# Patient Record
Sex: Male | Born: 1990
Health system: Southern US, Community
[De-identification: ages and names within clinical notes are randomized; demographics above are authoritative.]

## PROBLEM LIST (undated history)

## (undated) DIAGNOSIS — F419 Anxiety disorder, unspecified: Secondary | ICD-10-CM

## (undated) DIAGNOSIS — K219 Gastro-esophageal reflux disease without esophagitis: Secondary | ICD-10-CM

## (undated) DIAGNOSIS — E559 Vitamin D deficiency, unspecified: Secondary | ICD-10-CM

---

## 2012-04-27 ENCOUNTER — Emergency Department (HOSPITAL_COMMUNITY)
Admission: EM | Admit: 2012-04-27 | Discharge: 2012-04-27 | Disposition: A | Discharge status: Home / Self Care | Payer: Medicaid Other | Attending: Emergency Medicine | Admitting: Emergency Medicine

## 2012-04-27 ENCOUNTER — Encounter (HOSPITAL_COMMUNITY): Payer: Self-pay | Admitting: Emergency Medicine

## 2012-04-27 DIAGNOSIS — J45909 Unspecified asthma, uncomplicated: Secondary | ICD-10-CM | POA: Insufficient documentation

## 2012-04-27 DIAGNOSIS — N342 Other urethritis: Secondary | ICD-10-CM | POA: Insufficient documentation

## 2012-04-27 MED ORDER — LIDOCAINE HCL (PF) 1 % IJ SOLN
INTRAMUSCULAR | Status: AC
  Administered 2012-04-27: 0.9 mL
  Filled 2012-04-27: qty 5

## 2012-04-27 MED ORDER — CEFTRIAXONE SODIUM 250 MG IJ SOLR
250.0000 mg | Freq: Once | INTRAMUSCULAR | Status: AC
  Administered 2012-04-27: 250 mg via INTRAMUSCULAR
  Filled 2012-04-27: qty 250

## 2012-04-27 MED ORDER — AZITHROMYCIN 1 G PO PACK
1.0000 g | PACK | Freq: Once | ORAL | Status: AC
  Administered 2012-04-27: 1 g via ORAL
  Filled 2012-04-27: qty 1

## 2012-04-27 NOTE — ED Provider Notes (Signed)
History  This chart was scribed for Dione Booze, MD by Bennett Scrape. This patient was seen in room STRE5/STRE5 and the patient's care was started at 3:50PM.  CSN: [id number]  Arrival date & time 04/27/12  1259   First MD Initiated Contact with Patient 04/27/12 1550      Chief Complaint  Patient presents with  . SEXUALLY TRANSMITTED DISEASE    The history is provided by the patient. No language interpreter was used.    Cody [name] is a 21 y.o. male who presents to the Emergency Department complaining of one episode of penile discharge described as white about 2 weeks ago. He denies having the discharge now. He is requesting to be examined for STDs. He reports that he had unprotected sex before the onset of the discharge. He states that he was not able to be examined until now due to a move. He denies dysuria, abdominal pain or back pain. He has a h/o asthma.   He states that he is in the process of transferring to a new PCP.   Past Medical History  Diagnosis Date  . Asthma     No past surgical history on file.  No family history on file.  History  Substance Use Topics  . Smoking status: No  . Smokeless tobacco: Not on file  . Alcohol Use: Yes      Review of Systems  Constitutional: Negative for fever and chills.  Respiratory: Negative for shortness of breath.   Gastrointestinal: Negative for nausea, vomiting and abdominal pain.  Genitourinary: Positive for discharge. Negative for dysuria.  Musculoskeletal: Negative for back pain.  Neurological: Negative for weakness.    Allergies  Penicillins  Home Medications   Current Outpatient Rx  Name Route Sig Dispense Refill  . IBUPROFEN 200 MG PO TABS Oral Take 200 mg by mouth every 6 (six) hours as needed. For toothache.    . ADULT MULTIVITAMIN W/MINERALS CH Oral Take 1 tablet by mouth daily.      Triage Vitals: BP 110/54  Pulse 61  Temp 98.3 F (36.8 C)  Resp 16  Physical Exam  Nursing note and vitals  reviewed. Constitutional: He is oriented to person, place, and time. He appears well-developed and well-nourished. No distress.  HENT:  Head: Normocephalic and atraumatic.  Eyes:       Strabismus present with left eye deviating laterally.  Neck: Neck supple. No tracheal deviation present.  Cardiovascular: Normal rate.   Pulmonary/Chest: Effort normal. No respiratory distress.  Genitourinary:       Uncircumcised, shotty bilateral inguinal lymphadenopathy, no discharge seen  Musculoskeletal: Normal range of motion.  Neurological: He is alert and oriented to person, place, and time.  Skin: Skin is warm and dry.  Psychiatric: He has a normal mood and affect. His behavior is normal.    ED Course  Procedures (including critical care time)    COORDINATION OF CARE: 3:56PM-Discussed treatment for gonorrhea and chlamydia as including a vaccine, antibiotics and a bacterial culture with pt and pt agreed. Advised pt to contact all sexual partners since onset of discharge to let them know to get tested. Advised pt to use a condom with current partner until she has been tested.   Results for orders placed during the hospital encounter of 04/27/12  GC/CHLAMYDIA PROBE AMP, GENITAL      Component Value Range   GC Probe Amp, Genital NEGATIVE  NEGATIVE    Chlamydia, DNA Probe NEGATIVE  NEGATIVE   RPR  Component Value Range   RPR NON REACTIVE  NON REACTIVE   HIV ANTIBODY (ROUTINE TESTING)      Component Value Range   HIV NON REACTIVE  NON REACTIVE      1. Urethritis       MDM  Urethritis which is most likely GC or Chlamydia. He'll be empirically treated in the emergency department.   I personally performed the services described in this documentation, which was scribed in my presence. The recorded information has been reviewed and considered.         Dione Booze, MD 04/30/12 [phone]

## 2012-04-27 NOTE — ED Notes (Signed)
Had a d/c from his  Penis a couple of weeks ago

## 2012-04-27 NOTE — Discharge Instructions (Signed)
Urethritis, Adult Urethritis is an inflammation (soreness) of the urethra (the tube exiting from the bladder). It is often caused by germs that may be spread through sexual contact. TREATMENT  Urethritis will usually respond to antibiotics. These are medications that kill germs. Take all the medicine given to you. You may feel better in a couple days, but TAKE ALL MEDICINE or the infection may not be completely cured and may become more difficult to treat. Response can generally be expected in 7 to 10 days. You may require additional treatment after more testing. HOME CARE INSTRUCTIONS  Not have sex until the test results are known and treatment is completed.   Know that you may be asked to notify your sex partner when your final test results are back.   Finish all medications as prescribed.   Prevent sexually transmitted infections including AIDS. Practice safe sex. Use condoms.  SEEK MEDICAL CARE IF:   Your symptoms are not improved in 2 to 3 days.   Your symptoms are getting worse.   Your develop abdominal pain.   You develop joint pain.  SEEK IMMEDIATE MEDICAL CARE IF:   You have a fever.   You develop severe pain in the belly, back or side.   You develop repeated vomiting.  TEST RESULTS Not all test results are available during your visit. If your test results are not back during the visit, make an appointment with your caregiver to find out the results. Do not assume everything is normal if you have not heard from your caregiver or the medical facility. It is important for you to follow-up on all of your test results. Document Released: 06/03/2001 Document Revised: 11/27/2011 Document Reviewed: 12/24/2009 [facility] Patient Information 2012 [place], [place].  Safer Sex Your caregiver wants you to have this information about the infections that can be transmitted from sexual contact and how to prevent them. The idea behind safer sex is that you can be sexually active, and at the  same time reduce the risk of giving or getting a sexually transmitted disease (STD). Every person should be aware of how to prevent him or herself and his or her sex partner from getting an STD. CAUSES OF STDS STDs are transmitted by sharing body fluids, which contain viruses and bacteria. The following fluids all transmit infections during sexual intercourse and sex acts:  Semen.   Saliva.   Urine.   Blood.   Vaginal mucus.  Examples of STDs include:  Chlamydia.   Gonorrhea.   Genital herpes.   Hepatitis B.   Human immunodeficiency virus or acquired immunodeficiency syndrome (HIV or AIDS).   Syphilis.   Trichomonas.   Pubic lice.   Human papillomavirus (HPV), which may include:   Genital warts.   Cervical dysplasia.   Cervical cancer (can develop with certain types of HPV).  SYMPTOMS  Sexual diseases often cause few or no symptoms until they are advanced, so a person can be infected and spread the infection without knowing it. Some STDs respond to treatment very well. Others, like HIV and herpes, cannot be cured, but are treated to reduce their effects. Specific symptoms include:  Abnormal vaginal discharge.   Irritation or itching in and around the vagina, and in the pubic hair.   Pain during sexual intercourse.   Bleeding during sexual intercourse.   Pelvic or abdominal pain.   Fever.   Growths in and around the vagina.   An ulcer in or around the vagina.   Swollen glands in the  groin area.  DIAGNOSIS   Blood tests.   Pap test.   Culture test of abnormal vaginal discharge.   A test that applies a solution and examines the cervix with a lighted magnifying scope (colposcopy).   A test that examines the pelvis with a lighted tube, through a small incision (laparoscopy).  TREATMENT  The treatment will depend on the cause of the STD.  Antibiotic treatment by injection, oral, creams, or suppositories in the vagina.   Over-the-counter medicated  shampoo, to get rid of pubic lice.   Removing or treating growths with medicine, freezing, burning (electrocautery), or surgery.   Surgery treatment for HPV of the cervix.   Supportive medicines for herpes, HIV, AIDS, and hepatitis.  Being careful cannot eliminate all risk of infection, but sex can be made much safer. Safe sexual practices include body massage and gentle touching. Masturbation is safe, as long as body fluids do not contact skin that has sores or cuts. Dry kissing and oral sex on a man wearing a latex condom or on a woman wearing a male condom is also safe. Slightly less safe is intercourse while the man wears a latex condom or wet kissing. It is also safer to have one sex partner that you know is not having sex with anyone else. LENGTH OF ILLNESS An STD might be treated and cured in a week, sometimes a month, or more. And it can linger with symptoms for many years. STDs can also cause damage to the male organs. This can cause chronic pain, infertility, and recurrence of the STD, especially herpes, hepatitis, HIV, and HPV. HOME CARE INSTRUCTIONS AND PREVENTION  Alcohol and recreational drugs are often the reason given for not practicing safer sex. These substances affect your judgment. Alcohol and recreational drugs can also impair your immune system, making you more vulnerable to disease.   Do not engage in risky and dangerous sexual practices, including:   Vaginal or anal sex without a condom.   Oral sex on a man without a condom.   Oral sex on a woman without a male condom.   Using saliva to lubricate a condom.   Any other sexual contact in which body fluids or blood from one partner contact the other partner.   You should use only latex condoms for men and water soluble lubricants. Petroleum based lubricants or oils used to lubricate a condom will weaken the condom and increase the chance that it will break.   Think very carefully before having sex with anyone  who is high risk for STDs and HIV. This includes IV drug users, people with multiple sexual partners, or people who have had an STD, or a positive hepatitis or HIV blood test.   Remember that even if your partner has had only one previous partner, their previous partner might have had multiple partners. If so, you are at high risk of being exposed to an STD. You and your sex partner should be the only sex partners with each other, with no one else involved.   A vaccine is available for hepatitis B and HPV through your caregiver or the Public Health Department. Everyone should be vaccinated with these vaccines.   Avoid risky sex practices. Sex acts that can break the skin make you more likely to get an STD.  SEEK MEDICAL CARE IF:   If you think you have an STD, even if you do not have any symptoms. Contact your caregiver for evaluation and treatment, if needed.  You think or know your sex partner has acquired an STD.   You have any of the symptoms mentioned above.  Document Released: 01/15/2005 Document Revised: 11/27/2011 Document Reviewed: 11/07/2009 [facility] Patient Information 2012 [place], [place].

## 2012-04-28 LAB — GC/CHLAMYDIA PROBE AMP, GENITAL
Chlamydia, DNA Probe: NEGATIVE
GC Probe Amp, Genital: NEGATIVE

## 2013-01-12 ENCOUNTER — Emergency Department (HOSPITAL_COMMUNITY)
Admission: EM | Admit: 2013-01-12 | Discharge: 2013-01-12 | Disposition: A | Discharge status: Home / Self Care | Payer: Medicaid Other | Attending: Emergency Medicine | Admitting: Emergency Medicine

## 2013-01-12 ENCOUNTER — Encounter (HOSPITAL_COMMUNITY): Payer: Self-pay

## 2013-01-12 DIAGNOSIS — J3489 Other specified disorders of nose and nasal sinuses: Secondary | ICD-10-CM | POA: Insufficient documentation

## 2013-01-12 DIAGNOSIS — R000 Tachycardia, unspecified: Secondary | ICD-10-CM

## 2013-01-12 DIAGNOSIS — R002 Palpitations: Secondary | ICD-10-CM | POA: Insufficient documentation

## 2013-01-12 DIAGNOSIS — J069 Acute upper respiratory infection, unspecified: Secondary | ICD-10-CM

## 2013-01-12 DIAGNOSIS — R059 Cough, unspecified: Secondary | ICD-10-CM | POA: Insufficient documentation

## 2013-01-12 DIAGNOSIS — J45909 Unspecified asthma, uncomplicated: Secondary | ICD-10-CM | POA: Insufficient documentation

## 2013-01-12 DIAGNOSIS — R05 Cough: Secondary | ICD-10-CM | POA: Insufficient documentation

## 2013-01-12 NOTE — ED Notes (Signed)
Pt given discharge paperwork with no additional questions; pt verbalized understanding of discharge; VSS; e-signature obtained; 

## 2013-01-12 NOTE — ED Notes (Signed)
Cough congestion. Also feeling that his heart is racing. Also feel intermittent sob.    Pt. Denies any chest pain

## 2013-01-12 NOTE — ED Provider Notes (Signed)
History   Scribed for No att. providers found, the patient was seen in room TR08C/TR08C . This chart was scribed by Lewanda Rife.   CSN: [id number]  Arrival date & time 01/12/13  1417   First MD Initiated Contact with Patient 01/12/13 1756      Chief Complaint  Patient presents with  . Irregular Heart Beat    (Consider location/radiation/quality/duration/timing/severity/associated sxs/prior treatment) HPI Cody [name] is a 22 y.o. male with past medical history of asthma who presents to the Emergency Department complaining of 2 intermittent episodes of palpitations lasting 1-2 minutes each with an onset last night while sleeping. Pt states his girlfriend woke him from sleep when she noticed he had a high heart rate and the second time he woke up on his own with his "heart racing."   Pt denies chest pain, SOB, diaphoresis, or nausea with the episode.  Pt reports he woke up this morning with nasal congestion, non-productive dry cough and sinus pressure. Pt denies chest pain, shortness of breath, fever, chills, abdominal pain, nausea, vomiting, diarrhea, weakness, dizziness, rash, neck pain, back pain, syncope. Pt reports he has been under a lot of stress with 2 recent family deaths. Pt denies hx of palpitations in the past. Pt denies smoking cigarettes, drinking, and drugs.  Pt reports taking Zyrtec and ibuprofen with moderate relief of URI symptoms.  Neither of these medications are new to the patient and he has never had any reaction to the medications.    Past Medical History  Diagnosis Date  . Asthma     History reviewed. No pertinent past surgical history.  No family history on file.  History  Substance Use Topics  . Smoking status: Not on file  . Smokeless tobacco: Not on file  . Alcohol Use: Yes      Review of Systems  Constitutional: Negative.  Negative for fever and diaphoresis.  HENT: Positive for sinus pressure.   Respiratory: Positive for cough. Negative for  chest tightness and shortness of breath.   Cardiovascular: Positive for palpitations. Negative for chest pain.  Gastrointestinal: Negative.   Musculoskeletal: Negative.   Skin: Negative.   Neurological: Negative.   Hematological: Negative.   Psychiatric/Behavioral: Negative.   All other systems reviewed and are negative.    Allergies  Penicillins  Home Medications   Current Outpatient Rx  Name  Route  Sig  Dispense  Refill  . IBUPROFEN 200 MG PO TABS   Oral   Take 200 mg by mouth every 6 (six) hours as needed. For toothache.           BP 111/71  Pulse 58  Temp 97.6 F (36.4 C) (Oral)  Resp 16  Ht 5\' 8"  (1.727 m)  Wt 140 lb (63.504 kg)  BMI 21.29 kg/m2  SpO2 99%  Physical Exam  Constitutional: He is oriented to person, place, and time. He appears well-developed and well-nourished. No distress.  HENT:  Head: Normocephalic and atraumatic.  Right Ear: Tympanic membrane, external ear and ear canal normal.  Left Ear: Tympanic membrane, external ear and ear canal normal.  Nose: Mucosal edema and rhinorrhea present. No epistaxis. Right sinus exhibits no maxillary sinus tenderness and no frontal sinus tenderness. Left sinus exhibits no maxillary sinus tenderness and no frontal sinus tenderness.  Mouth/Throat: Uvula is midline, oropharynx is clear and moist and mucous membranes are normal. Mucous membranes are not pale and not cyanotic. No oropharyngeal exudate, posterior oropharyngeal edema, posterior oropharyngeal erythema or tonsillar abscesses.  No sinus tenderness  Eyes: Conjunctivae normal are normal. Pupils are equal, round, and reactive to light.  Neck: Normal range of motion and full passive range of motion without pain. Neck supple. No tracheal tenderness present. No mass and no thyromegaly present.  Cardiovascular: Normal rate, regular rhythm, S1 normal, S2 normal, normal heart sounds and intact distal pulses.  Exam reveals no gallop and no friction rub.   No  murmur heard. Pulses:      Radial pulses are 2+ on the right side, and 2+ on the left side.       Dorsalis pedis pulses are 2+ on the right side, and 2+ on the left side.       Posterior tibial pulses are 2+ on the right side, and 2+ on the left side.  Pulmonary/Chest: Effort normal and breath sounds normal. No stridor. No respiratory distress. He has no decreased breath sounds. He has no wheezes. He has no rhonchi. He has no rales. He exhibits no tenderness.  Abdominal: Soft. Bowel sounds are normal. He exhibits no distension and no mass. There is no tenderness. There is no rebound and no guarding.  Musculoskeletal: Normal range of motion. He exhibits no edema and no tenderness.  Lymphadenopathy:    He has no cervical adenopathy.  Neurological: He is alert and oriented to person, place, and time. He exhibits normal muscle tone. Coordination normal.  Skin: Skin is warm and dry. No rash noted. He is not diaphoretic.  Psychiatric: He has a normal mood and affect.    ED Course  Procedures (including critical care time)  Labs Reviewed - No data to display No results found.  ECG:  Date: 01/12/2013  Rate: 56  Rhythm: sinus bradycardia  QRS Axis: normal  Intervals: normal  ST/T Wave abnormalities: early repolarization  Conduction Disutrbances:none  Narrative Interpretation: sinus bradycardia with early repol pattern, nonischemic ECG  Old EKG Reviewed: none available    1. Viral URI   2. Tachycardia       MDM  Elvina Mattes presents with c/o tachycardic episodes last PM.  Pt ECG with early repol pattern, but no tachycardia today.  Pt without known cause for tachycardia.  Denies drug use.  Pt does admit to family stress including the recent death of several family members.  May be anxiety related.  Concern for episodes of SVT, though not evidence of such at this time. Will refer to cardiology for further evaluation.  Pt without chest pain or other associated symptoms including nausea,  weakness, SOB or diaphoresis.  Pt also with mild URI symptoms which he is treating with OTC medications and is satisfied with that treatment.  I have also discussed reasons to return immediately to the ER including further episodes of tachycardia.  Patient expresses understanding and agrees with plan.   1. Medications: usual home medications including mucinex WITHOUT decongestant for cold symptoms 2. Treatment: rest, drink plenty of fluids, avoid medications including decongestants that can cause a fast heart rate 3. Follow Up: Please followup with your primary doctor for discussion of your diagnoses and further evaluation after today's visit; if you do not have a primary care doctor use the resource guide provided to find one; follow-up with cardiology in reference to today's visit    I personally performed the services described in this documentation, which was scribed in my presence. The recorded information has been reviewed and is accurate. '  Dahlia Client Ryelee Albee, PA-C 01/12/13 2320

## 2013-01-13 NOTE — ED Provider Notes (Signed)
Medical screening examination/treatment/procedure(s) were performed by non-physician practitioner and as supervising physician I was immediately available for consultation/collaboration.   Gaelle Adriance M Ivyrose Hashman, DO 01/13/13 1559 

## 2013-01-27 ENCOUNTER — Encounter (HOSPITAL_COMMUNITY): Payer: Self-pay | Admitting: Cardiology

## 2013-01-27 ENCOUNTER — Emergency Department (HOSPITAL_COMMUNITY)
Admission: EM | Admit: 2013-01-27 | Discharge: 2013-01-27 | Disposition: A | Discharge status: Home / Self Care | Payer: Medicaid Other | Attending: Emergency Medicine | Admitting: Emergency Medicine

## 2013-01-27 DIAGNOSIS — Z202 Contact with and (suspected) exposure to infections with a predominantly sexual mode of transmission: Secondary | ICD-10-CM

## 2013-01-27 DIAGNOSIS — N480 Leukoplakia of penis: Secondary | ICD-10-CM | POA: Insufficient documentation

## 2013-01-27 DIAGNOSIS — J45909 Unspecified asthma, uncomplicated: Secondary | ICD-10-CM | POA: Insufficient documentation

## 2013-01-27 DIAGNOSIS — N4889 Other specified disorders of penis: Secondary | ICD-10-CM | POA: Insufficient documentation

## 2013-01-27 DIAGNOSIS — N481 Balanitis: Secondary | ICD-10-CM

## 2013-01-27 MED ORDER — CLOTRIMAZOLE 1 % EX CREA: TOPICAL_CREAM | CUTANEOUS | Status: DC

## 2013-01-27 MED ORDER — DOXYCYCLINE HYCLATE 100 MG PO CAPS: 100.0000 mg | ORAL_CAPSULE | Freq: Two times a day (BID) | ORAL | Status: DC

## 2013-01-27 MED ORDER — DOXYCYCLINE HYCLATE 100 MG PO TABS
100.0000 mg | ORAL_TABLET | Freq: Once | ORAL | Status: AC
  Administered 2013-01-27: 100 mg via ORAL
  Filled 2013-01-27: qty 1

## 2013-01-27 MED ORDER — AZITHROMYCIN 250 MG PO TABS
1000.0000 mg | ORAL_TABLET | Freq: Once | ORAL | Status: AC
  Administered 2013-01-27: 1000 mg via ORAL
  Filled 2013-01-27: qty 4

## 2013-01-27 NOTE — ED Notes (Signed)
Patient states he had injured his penis a few weeks ago and had pus coming from it and from the tip of his penis. It has not improved since then. He states that it increases in pain when he pulls his fore skin back and it is so swollen he cant even do that.

## 2013-01-27 NOTE — ED Notes (Signed)
Pt reports about 3 weeks ago he injured his penis while having sex with his girlfriend. States redness and swelling to the foreskin. Reports area is painful but no problems with urination.

## 2013-01-27 NOTE — ED Provider Notes (Signed)
History     CSN: [id number]  Arrival date & time 01/27/13  1145   First MD Initiated Contact with Patient 01/27/13 1326      Chief Complaint  Patient presents with  . Groin Pain    (Consider location/radiation/quality/duration/timing/severity/associated sxs/prior treatment) Patient is a 22 y.o. male presenting with groin pain. The history is provided by the patient.  Groin Pain This is a new problem. The current episode started 1 to 4 weeks ago. The problem occurs daily. The problem has been gradually worsening. Pertinent negatives include no abdominal pain, anorexia, arthralgias, chest pain, chills, coughing, fever, neck pain or urinary symptoms. Associated symptoms comments: No discharge from the penis, but swelling of the foreskin, and increased redness.. The symptoms are aggravated by intercourse (palpation. attempting to retract foreskin.). He has tried nothing for the symptoms.    Past Medical History  Diagnosis Date  . Asthma     History reviewed. No pertinent past surgical history.  History reviewed. No pertinent family history.  History  Substance Use Topics  . Smoking status: Not on file  . Smokeless tobacco: Not on file  . Alcohol Use: Yes      Review of Systems  Constitutional: Negative for fever, chills and activity change.       All ROS Neg except as noted in HPI  HENT: Negative for nosebleeds and neck pain.   Eyes: Negative for photophobia and discharge.  Respiratory: Negative for cough, shortness of breath and wheezing.   Cardiovascular: Negative for chest pain and palpitations.  Gastrointestinal: Negative for abdominal pain, blood in stool and anorexia.  Genitourinary: Positive for penile swelling and penile pain. Negative for dysuria, urgency, frequency, hematuria, discharge, scrotal swelling, difficulty urinating and testicular pain.  Musculoskeletal: Negative for back pain and arthralgias.  Skin: Negative.   Neurological: Negative for dizziness,  seizures and speech difficulty.  Psychiatric/Behavioral: Negative for hallucinations and confusion. The patient is nervous/anxious.     Allergies  Penicillins and Zyrtec  Home Medications   Current Outpatient Rx  Name  Route  Sig  Dispense  Refill  . IBUPROFEN 200 MG PO TABS   Oral   Take 200 mg by mouth every 6 (six) hours as needed. For toothache.         Marland Kitchen CLOTRIMAZOLE 1 % EX CREA      Apply to affected area 2 times daily for 7 to 10 days.   12 g   0   . DOXYCYCLINE HYCLATE 100 MG PO CAPS   Oral   Take 1 capsule (100 mg total) by mouth 2 (two) times daily.   14 capsule   0     BP 130/65  Pulse 82  Temp 98.3 F (36.8 C) (Oral)  Resp 18  SpO2 100%  Physical Exam  Nursing note and vitals reviewed. Constitutional: He is oriented to person, place, and time. He appears well-developed and well-nourished.  Non-toxic appearance.  HENT:  Head: Normocephalic.  Right Ear: Tympanic membrane and external ear normal.  Left Ear: Tympanic membrane and external ear normal.  Eyes: EOM and lids are normal. Pupils are equal, round, and reactive to light.  Neck: Normal range of motion. Neck supple. Carotid bruit is not present.  Cardiovascular: Normal rate, regular rhythm, normal heart sounds, intact distal pulses and normal pulses.   Pulmonary/Chest: Breath sounds normal. No respiratory distress.  Abdominal: Soft. Bowel sounds are normal. There is no tenderness. There is no guarding.  Genitourinary:  Pt has 4 broken skin areas of the foreskin just behind the head of the penis. Several broken skin areas of the foreskin at the head. No lesion of the testicles. Few small inguinal lymph nodes.  Musculoskeletal: Normal range of motion.  Lymphadenopathy:       Head (right side): No submandibular adenopathy present.       Head (left side): No submandibular adenopathy present.    He has no cervical adenopathy.  Neurological: He is alert and oriented to person, place, and time. He  has normal strength. No cranial nerve deficit or sensory deficit.  Skin: Skin is warm and dry.  Psychiatric: His speech is normal. His mood appears anxious.    ED Course  Procedures (including critical care time)  Labs Reviewed - No data to display No results found.   1. Balanitis   2. Possible exposure to STD       MDM  I have reviewed nursing notes, vital signs, and all appropriate lab and imaging results for this patient.  Pt's girlfriend was recently treated for STD. Pt treated today with zithromax and doxycycline(allergic to penicillin). Pt has broken skin areas that appear traumatic on the foreskin. Redness and pain when foreskin retracted. Pt advised on the importance  Of good foreskin cleansing, especially after intercourse. Rx for clotrimazol given for assistance with balanitis. [facility] week.       Kathie Dike, [place] 01/27/13 2259

## 2013-01-28 ENCOUNTER — Emergency Department (HOSPITAL_COMMUNITY)
Admission: EM | Admit: 2013-01-28 | Discharge: 2013-01-28 | Disposition: A | Discharge status: Home / Self Care | Payer: Medicaid Other | Attending: Emergency Medicine | Admitting: Emergency Medicine

## 2013-01-28 ENCOUNTER — Encounter (HOSPITAL_COMMUNITY): Payer: Self-pay | Admitting: *Deleted

## 2013-01-28 DIAGNOSIS — N471 Phimosis: Secondary | ICD-10-CM | POA: Insufficient documentation

## 2013-01-28 DIAGNOSIS — N478 Other disorders of prepuce: Secondary | ICD-10-CM | POA: Insufficient documentation

## 2013-01-28 DIAGNOSIS — J45909 Unspecified asthma, uncomplicated: Secondary | ICD-10-CM | POA: Insufficient documentation

## 2013-01-28 DIAGNOSIS — Z79899 Other long term (current) drug therapy: Secondary | ICD-10-CM | POA: Insufficient documentation

## 2013-01-28 DIAGNOSIS — R300 Dysuria: Secondary | ICD-10-CM | POA: Insufficient documentation

## 2013-01-28 MED ORDER — BACITRACIN ZINC 500 UNIT/GM EX OINT: TOPICAL_OINTMENT | Freq: Two times a day (BID) | CUTANEOUS | Status: DC

## 2013-01-28 MED ORDER — BACITRACIN ZINC 500 UNIT/GM EX OINT
1.0000 "application " | TOPICAL_OINTMENT | Freq: Two times a day (BID) | CUTANEOUS | Status: DC
  Administered 2013-01-28: 1 via TOPICAL
  Filled 2013-01-28: qty 15

## 2013-01-28 NOTE — ED Provider Notes (Signed)
History     CSN: [id number]  Arrival date & time 01/28/13  1248   First MD Initiated Contact with Patient 01/28/13 1338      Chief Complaint  Patient presents with  . Penis Pain    (Consider location/radiation/quality/duration/timing/severity/associated sxs/prior treatment) HPI Comments: The patient was seen and treated here yesterday for balanitis. He had some phimosis, but has been unable to retract his foreskin. He has slight increase in swelling and with retraction of the foreskin is now developing some cracking of the skin. He has mild dysuria when he urinates, this is related to his foreskin, he denies any penile discharge, he also describes to me that he has had a history of similar phimosis in the past which was not nearly as severe. He is uncircumcised. The symptoms are persistent, gradually worsening, nothing makes it better, worse with trying to retract it. He denies any fevers or chills nausea or vomiting.  Patient is a 22 y.o. male presenting with penile pain. The history is provided by the patient.  Penis Pain    Past Medical History  Diagnosis Date  . Asthma     History reviewed. No pertinent past surgical history.  No family history on file.  History  Substance Use Topics  . Smoking status: Never Smoker   . Smokeless tobacco: Not on file  . Alcohol Use: No      Review of Systems  Genitourinary: Positive for penile pain.  All other systems reviewed and are negative.    Allergies  Penicillins and Zyrtec  Home Medications   Current Outpatient Rx  Name  Route  Sig  Dispense  Refill  . CLOTRIMAZOLE 1 % EX CREA      Apply to affected area 2 times daily for 7 to 10 days.   12 g   0   . DOXYCYCLINE HYCLATE 100 MG PO CAPS   Oral   Take 1 capsule (100 mg total) by mouth 2 (two) times daily.   14 capsule   0   . IBUPROFEN 200 MG PO TABS   Oral   Take 200 mg by mouth every 6 (six) hours as needed. For toothache.         Marland Kitchen BACITRACIN ZINC 500  UNIT/GM EX OINT   Topical   Apply topically 2 (two) times daily.   120 g   0     BP 147/54  Pulse 67  Temp 98.1 F (36.7 C) (Oral)  Resp 16  SpO2 99%  Physical Exam  Nursing note and vitals reviewed. Constitutional: He appears well-developed and well-nourished. No distress.  HENT:  Head: Normocephalic and atraumatic.  Mouth/Throat: Oropharynx is clear and moist. No oropharyngeal exudate.  Eyes: Conjunctivae normal and EOM are normal. Pupils are equal, round, and reactive to light. Right eye exhibits no discharge. Left eye exhibits no discharge. No scleral icterus.  Neck: Normal range of motion. Neck supple. No JVD present. No thyromegaly present.  Cardiovascular: Normal rate, regular rhythm, normal heart sounds and intact distal pulses.  Exam reveals no gallop and no friction rub.   No murmur heard. Pulmonary/Chest: Effort normal and breath sounds normal. No respiratory distress. He has no wheezes. He has no rales.  Abdominal: Soft. Bowel sounds are normal. He exhibits no distension and no mass. There is no tenderness.  Genitourinary:        Phimosis present, unable to retract the foreskin of the penis, normal appearing testicles and scrotum, no discharge, the patient is able to  urinate  Musculoskeletal: Normal range of motion. He exhibits no edema and no tenderness.  Lymphadenopathy:    He has no cervical adenopathy.  Neurological: He is alert. Coordination normal.  Skin: Skin is warm and dry. No rash noted. No erythema.  Psychiatric: He has a normal mood and affect. His behavior is normal.    ED Course  Procedures (including critical care time)  Labs Reviewed - No data to display No results found.   1. Phimosis       MDM  Clinically the patient has a phimosis, he has been taking doxycycline, I will advise him to use bacitracin and Vaseline to try retract his foreskin, urology has been paged.   I have been unable to fully retract the foreskin however he is able  to urinate and does not have any discharge or erythema or swelling of the foreskin. I have discussed his care with the nurse for the urologist who is on call. I have asked that he can be seen on Monday in the office. He will call to make an appointment today.     Vida Roller, MD 01/28/13 [phone]

## 2013-01-28 NOTE — ED Notes (Addendum)
Pt was tx here and discharged after being tx for poss std.  He was told to pull back the foreskin to clean himself.  However, this am, he felt like he was going to tear if he pulled back.  Also feels increased swelling and pain. PT is able to urinate.

## 2013-01-31 NOTE — ED Provider Notes (Signed)
Medical screening examination/treatment/procedure(s) were performed by non-physician practitioner and as supervising physician I was immediately available for consultation/collaboration.   Cristina Ceniceros E Emireth Cockerham, MD 01/31/13 0744 

## 2013-03-03 ENCOUNTER — Other Ambulatory Visit: Payer: Self-pay | Admitting: Urology

## 2013-03-16 ENCOUNTER — Encounter (HOSPITAL_BASED_OUTPATIENT_CLINIC_OR_DEPARTMENT_OTHER): Payer: Self-pay | Admitting: *Deleted

## 2013-03-16 NOTE — Progress Notes (Signed)
To [facility] at 0715-Hg on arrival-NPO after Mn.

## 2013-03-17 ENCOUNTER — Emergency Department (HOSPITAL_COMMUNITY)
Admission: EM | Admit: 2013-03-17 | Discharge: 2013-03-17 | Disposition: A | Discharge status: Home / Self Care | Payer: Medicaid Other | Attending: Emergency Medicine | Admitting: Emergency Medicine

## 2013-03-17 ENCOUNTER — Encounter (HOSPITAL_COMMUNITY): Payer: Self-pay | Admitting: Emergency Medicine

## 2013-03-17 DIAGNOSIS — J45909 Unspecified asthma, uncomplicated: Secondary | ICD-10-CM | POA: Insufficient documentation

## 2013-03-17 DIAGNOSIS — R0981 Nasal congestion: Secondary | ICD-10-CM

## 2013-03-17 DIAGNOSIS — J3489 Other specified disorders of nose and nasal sinuses: Secondary | ICD-10-CM | POA: Insufficient documentation

## 2013-03-17 MED ORDER — LORATADINE 10 MG PO TABS: 10.0000 mg | ORAL_TABLET | Freq: Every day | ORAL | Status: DC

## 2013-03-17 MED ORDER — ASPIRIN-ACETAMINOPHEN-CAFFEINE 250-250-65 MG PO TABS: 1.0000 | ORAL_TABLET | Freq: Four times a day (QID) | ORAL | Status: DC | PRN

## 2013-03-17 NOTE — ED Notes (Signed)
Feels like sinus are backing up occ feet go numb

## 2013-03-17 NOTE — ED Provider Notes (Signed)
History     CSN: [id number]  Arrival date & time 03/17/13  1122   First MD Initiated Contact with Patient 03/17/13 1145      Chief Complaint  Patient presents with  . Headache    (Consider location/radiation/quality/duration/timing/severity/associated sxs/prior treatment) HPI Comments: Patient is a 22 year old male with a past medical history of asthma who presents with a headache for 3 weeks. Patient reports a gradual onset and progressive worsening of the headache. The pain is aching, intermittent and is located in generalized head without radiation. Patient has tried ibuprofen for symptoms without relief. No alleviating/aggravating factors. Patient reports associated nasal congestion. Patient denies fever, nausea, photophobia, vomiting, diarrhea, numbness/tingling, weakness, visual changes, chest pain, SOB, abdominal pain.      Past Medical History  Diagnosis Date  . Asthma     childhood    No past surgical history on file.  No family history on file.  History  Substance Use Topics  . Smoking status: Never Smoker   . Smokeless tobacco: Not on file  . Alcohol Use: No      Review of Systems  Neurological: Positive for headaches.  All other systems reviewed and are negative.    Allergies  Penicillins and Zyrtec  Home Medications   Current Outpatient Rx  Name  Route  Sig  Dispense  Refill  . bacitracin ointment   Topical   Apply topically 2 (two) times daily.   120 g   0   . clotrimazole (LOTRIMIN) 1 % cream      Apply to affected area 2 times daily for 7 to 10 days.   12 g   0   . doxycycline (VIBRAMYCIN) 100 MG capsule   Oral   Take 1 capsule (100 mg total) by mouth 2 (two) times daily.   14 capsule   0   . ibuprofen (ADVIL,MOTRIN) 200 MG tablet   Oral   Take 200 mg by mouth every 6 (six) hours as needed. For toothache.           BP 123/69  Pulse 60  Temp(Src) 97.7 F (36.5 C)  Resp 16  SpO2 99%  Physical Exam  Nursing note and  vitals reviewed. Constitutional: He is oriented to person, place, and time. He appears well-developed and well-nourished. No distress.  HENT:  Head: Normocephalic and atraumatic.  Mouth/Throat: Oropharynx is clear and moist. No oropharyngeal exudate.  NO maxillary or sinus tenderness to palpation.   Eyes: Conjunctivae are normal. Pupils are equal, round, and reactive to light.  Neck: Normal range of motion. Neck supple.  Cardiovascular: Normal rate and regular rhythm.  Exam reveals no gallop and no friction rub.   No murmur heard. Pulmonary/Chest: Effort normal and breath sounds normal. He has no wheezes. He has no rales. He exhibits no tenderness.  Abdominal: Soft. There is no tenderness.  Musculoskeletal: Normal range of motion.  Neurological: He is alert and oriented to person, place, and time. No cranial nerve deficit. Coordination normal.  Speech is goal-oriented. Moves limbs without ataxia.   Skin: Skin is warm and dry.  Psychiatric: He has a normal mood and affect. His behavior is normal.    ED Course  Procedures (including critical care time)  Labs Reviewed - No data to display No results found.   1. Sinus congestion       MDM  11:57 AM Patient likely has a headache due to sinus congestion. Patient reports relief with Neti pot. He denies any injury  or known triggers. He has no neuro deficits. Patient will be treated with claritin and excedrin. Patient instructed to follow up with PCP. Patient instructed to return with worsening or concerning symptoms.         Emilia Beck, [place] 03/18/13 1906

## 2013-03-17 NOTE — ED Notes (Signed)
H/a x 3 weeks has not seen  His reg dr

## 2013-03-17 NOTE — ED Notes (Addendum)
Pt reports HA X3 weeks that is daily and random.  Pt states his headaches can be triggered "by things people say or random things". Pt reports headache causes occassional numbness in his feet.  Pt states he has surgery scheduled on 4/2 and was told to see PCP about headaches. Pt has used netty pot for sinus and headache was back the next day. Tylenol and advil do not help.  Pt alert oriented X4

## 2013-03-19 NOTE — ED Provider Notes (Signed)
Medical screening examination/treatment/procedure(s) were performed by non-physician practitioner and as supervising physician I was immediately available for consultation/collaboration.  Derwood Kaplan, MD 03/19/13 1540

## 2013-03-23 ENCOUNTER — Ambulatory Visit (HOSPITAL_BASED_OUTPATIENT_CLINIC_OR_DEPARTMENT_OTHER)
Admission: RE | Admit: 2013-03-23 | Discharge: 2013-03-23 | Disposition: A | Discharge status: Home / Self Care | Payer: Medicaid Other | Source: Ambulatory Visit | Attending: Urology | Admitting: Urology

## 2013-03-23 ENCOUNTER — Encounter (HOSPITAL_BASED_OUTPATIENT_CLINIC_OR_DEPARTMENT_OTHER): Payer: Self-pay | Admitting: *Deleted

## 2013-03-23 ENCOUNTER — Encounter (HOSPITAL_BASED_OUTPATIENT_CLINIC_OR_DEPARTMENT_OTHER): Payer: Self-pay | Admitting: Anesthesiology

## 2013-03-23 ENCOUNTER — Encounter (HOSPITAL_BASED_OUTPATIENT_CLINIC_OR_DEPARTMENT_OTHER)
Admission: RE | Disposition: A | Discharge status: Home / Self Care | Payer: Self-pay | Source: Ambulatory Visit | Attending: Urology

## 2013-03-23 ENCOUNTER — Ambulatory Visit (HOSPITAL_BASED_OUTPATIENT_CLINIC_OR_DEPARTMENT_OTHER): Payer: Medicaid Other | Admitting: Anesthesiology

## 2013-03-23 DIAGNOSIS — N478 Other disorders of prepuce: Secondary | ICD-10-CM | POA: Insufficient documentation

## 2013-03-23 DIAGNOSIS — Z888 Allergy status to other drugs, medicaments and biological substances status: Secondary | ICD-10-CM | POA: Insufficient documentation

## 2013-03-23 DIAGNOSIS — Z880 Allergy status to penicillin: Secondary | ICD-10-CM | POA: Insufficient documentation

## 2013-03-23 DIAGNOSIS — Z8709 Personal history of other diseases of the respiratory system: Secondary | ICD-10-CM | POA: Insufficient documentation

## 2013-03-23 DIAGNOSIS — N471 Phimosis: Secondary | ICD-10-CM | POA: Insufficient documentation

## 2013-03-23 DIAGNOSIS — K219 Gastro-esophageal reflux disease without esophagitis: Secondary | ICD-10-CM | POA: Insufficient documentation

## 2013-03-23 DIAGNOSIS — N476 Balanoposthitis: Secondary | ICD-10-CM | POA: Insufficient documentation

## 2013-03-23 HISTORY — PX: CIRCUMCISION: SHX1350

## 2013-03-23 SURGERY — CIRCUMCISION, ADULT
Anesthesia: General | Site: Penis | Wound class: Clean

## 2013-03-23 MED ORDER — FENTANYL CITRATE 0.05 MG/ML IJ SOLN
25.0000 ug | INTRAMUSCULAR | Status: DC | PRN
  Filled 2013-03-23: qty 1

## 2013-03-23 MED ORDER — LIDOCAINE HCL (CARDIAC) 20 MG/ML IV SOLN
INTRAVENOUS | Status: DC | PRN
  Administered 2013-03-23: 50 mg via INTRAVENOUS

## 2013-03-23 MED ORDER — LIDOCAINE HCL (PF) 1 % IJ SOLN
INTRAMUSCULAR | Status: DC | PRN
  Administered 2013-03-23: 10 mL

## 2013-03-23 MED ORDER — CLINDAMYCIN PHOSPHATE 900 MG/50ML IV SOLN
900.0000 mg | Freq: Once | INTRAVENOUS | Status: AC
  Administered 2013-03-23: 900 mg via INTRAVENOUS
  Filled 2013-03-23: qty 50

## 2013-03-23 MED ORDER — PROPOFOL 10 MG/ML IV BOLUS
INTRAVENOUS | Status: DC | PRN
Start: 2013-03-23 — End: 2013-03-23
  Administered 2013-03-23: 200 mg via INTRAVENOUS

## 2013-03-23 MED ORDER — MEPERIDINE HCL 25 MG/ML IJ SOLN
6.2500 mg | INTRAMUSCULAR | Status: DC | PRN
  Filled 2013-03-23: qty 1

## 2013-03-23 MED ORDER — FENTANYL CITRATE 0.05 MG/ML IJ SOLN
INTRAMUSCULAR | Status: DC | PRN
  Administered 2013-03-23: 25 ug via INTRAVENOUS
  Administered 2013-03-23 (×2): 50 ug via INTRAVENOUS

## 2013-03-23 MED ORDER — TRAMADOL HCL 50 MG PO TABS
50.0000 mg | ORAL_TABLET | Freq: Four times a day (QID) | ORAL | Status: DC | PRN
  Filled 2013-03-23: qty 1

## 2013-03-23 MED ORDER — LACTATED RINGERS IV SOLN
INTRAVENOUS | Status: DC
  Administered 2013-03-23 (×2): via INTRAVENOUS
  Filled 2013-03-23: qty 1000

## 2013-03-23 MED ORDER — TRAMADOL HCL 50 MG PO TABS: 50.0000 mg | ORAL_TABLET | Freq: Four times a day (QID) | ORAL | Status: DC | PRN

## 2013-03-23 MED ORDER — ONDANSETRON HCL 4 MG/2ML IJ SOLN
INTRAMUSCULAR | Status: DC | PRN
  Administered 2013-03-23: 4 mg via INTRAVENOUS

## 2013-03-23 MED ORDER — SENNA-DOCUSATE SODIUM 8.6-50 MG PO TABS: 1.0000 | ORAL_TABLET | Freq: Two times a day (BID) | ORAL | Status: DC

## 2013-03-23 MED ORDER — LACTATED RINGERS IV SOLN
INTRAVENOUS | Status: DC
  Filled 2013-03-23: qty 1000

## 2013-03-23 MED ORDER — PROMETHAZINE HCL 25 MG/ML IJ SOLN
6.2500 mg | INTRAMUSCULAR | Status: DC | PRN
  Filled 2013-03-23: qty 1

## 2013-03-23 MED ORDER — DEXAMETHASONE SODIUM PHOSPHATE 4 MG/ML IJ SOLN
INTRAMUSCULAR | Status: DC | PRN
  Administered 2013-03-23: 8 mg via INTRAVENOUS

## 2013-03-23 MED ORDER — MIDAZOLAM HCL 5 MG/5ML IJ SOLN
INTRAMUSCULAR | Status: DC | PRN
  Administered 2013-03-23: 2 mg via INTRAVENOUS

## 2013-03-23 SURGICAL SUPPLY — 35 items
BANDAGE COBAN STERILE 2 (GAUZE/BANDAGES/DRESSINGS) ×2 IMPLANT
BANDAGE CONFORM 2  STR LF (GAUZE/BANDAGES/DRESSINGS) ×2 IMPLANT
BLADE SURG 15 STRL LF DISP TIS (BLADE) ×1 IMPLANT
BLADE SURG 15 STRL SS (BLADE) ×1
CATH FOLEY 2WAY SLVR  5CC 16FR (CATHETERS) ×1
CATH FOLEY 2WAY SLVR 5CC 16FR (CATHETERS) ×1 IMPLANT
CLOTH BEACON ORANGE TIMEOUT ST (SAFETY) ×2 IMPLANT
COVER MAYO STAND STRL (DRAPES) ×2 IMPLANT
COVER TABLE BACK 60X90 (DRAPES) ×2 IMPLANT
DECANTER SPIKE VIAL GLASS SM (MISCELLANEOUS) IMPLANT
DRAPE LAPAROTOMY T 102X78X121 (DRAPES) ×2 IMPLANT
DRAPE PED LAPAROTOMY (DRAPES) ×2 IMPLANT
ELECT NEEDLE TIP 2.8 STRL (NEEDLE) ×2 IMPLANT
ELECT REM PT RETURN 9FT ADLT (ELECTROSURGICAL) ×4
ELECTRODE REM PT RTRN 9FT ADLT (ELECTROSURGICAL) ×2 IMPLANT
GAUZE SPONGE 4X4 12PLY STRL LF (GAUZE/BANDAGES/DRESSINGS) ×2 IMPLANT
GAUZE SPONGE 4X4 16PLY XRAY LF (GAUZE/BANDAGES/DRESSINGS) ×2 IMPLANT
GAUZE XEROFORM 1X8 LF (GAUZE/BANDAGES/DRESSINGS) ×2 IMPLANT
GLOVE BIO SURGEON STRL SZ 6.5 (GLOVE) ×2 IMPLANT
GLOVE BIO SURGEON STRL SZ7 (GLOVE) ×2 IMPLANT
GLOVE BIO SURGEON STRL SZ7.5 (GLOVE) ×2 IMPLANT
GLOVE ECLIPSE 6.0 STRL STRAW (GLOVE) ×2 IMPLANT
GOWN STRL REIN XL XLG (GOWN DISPOSABLE) ×2 IMPLANT
GOWN SURGICAL LARGE (GOWNS) ×4 IMPLANT
NEEDLE HYPO 25X1 1.5 SAFETY (NEEDLE) ×2 IMPLANT
NS IRRIG 500ML POUR BTL (IV SOLUTION) ×2 IMPLANT
PACK BASIN DAY SURGERY FS (CUSTOM PROCEDURE TRAY) ×2 IMPLANT
PENCIL BUTTON HOLSTER BLD 10FT (ELECTRODE) ×2 IMPLANT
SPONGE GAUZE 4X4 12PLY (GAUZE/BANDAGES/DRESSINGS) ×2 IMPLANT
SUT CHROMIC 4 0 P 3 18 (SUTURE) ×4 IMPLANT
SUT CHROMIC 4 0 PS 2 18 (SUTURE) ×2 IMPLANT
SYR CONTROL 10ML LL (SYRINGE) ×2 IMPLANT
TOWEL OR 17X26 10 PK STRL BLUE (TOWEL DISPOSABLE) ×2 IMPLANT
TRAY DSU PREP LF (CUSTOM PROCEDURE TRAY) ×2 IMPLANT
WATER STERILE IRR 500ML POUR (IV SOLUTION) IMPLANT

## 2013-03-23 NOTE — Anesthesia Postprocedure Evaluation (Signed)
  Anesthesia Post-op Note  Patient: Cody [name]  Procedure(s) Performed: Procedure(s) (LRB): Circumcision   (N/A)  Patient Location: PACU  Anesthesia Type: General  Level of Consciousness: awake and alert   Airway and Oxygen Therapy: Patient Spontanous Breathing  Post-op Pain: mild  Post-op Assessment: Post-op Vital signs reviewed, Patient's Cardiovascular Status Stable, Respiratory Function Stable, Patent Airway and No signs of Nausea or vomiting  Last Vitals:  Filed Vitals:   03/23/13 1000  BP: 117/57  Pulse: 81  Temp:   Resp: 17    Post-op Vital Signs: stable   Complications: No apparent anesthesia complications

## 2013-03-23 NOTE — Transfer of Care (Signed)
Immediate Anesthesia Transfer of Care Note  Patient: Cody [name]  Procedure(s) Performed: Procedure(s) (LRB): Circumcision   (N/A)  Patient Location: PACU  Anesthesia Type: General  Level of Consciousness: awake, oriented, sedated and patient cooperative  Airway & Oxygen Therapy: Patient Spontanous Breathing and Patient connected to face mask oxygen  Post-op Assessment: Report given to PACU RN and Post -op Vital signs reviewed and stable  Post vital signs: Reviewed and stable  Complications: No apparent anesthesia complications

## 2013-03-23 NOTE — Op Note (Signed)
NAMEMarland Kitchen  ASAR, EVILSIZER NO.:  [id number]  MEDICAL RECORD NO.:  [id number]  LOCATION:                               FACILITY:  [facility]  PHYSICIAN:  Sebastian Ache, MD     DATE OF BIRTH:  01/20/1991  DATE OF PROCEDURE: 03/23/2013 DATE OF DISCHARGE:  03/23/2013                              OPERATIVE REPORT   DIAGNOSIS:  Phimosis with recurrent balanitis.  PROCEDURE: 1. Circumcision. 2. Penile block.  COMPLICATIONS:  None.  SPECIMEN:  Phimotic foreskin, for permanant pathology.  ESTIMATED BLOOD LOSS:  Less than 20 mL.  DRAINS:  None.  INDICATION:  Cody [name] is a pleasant young gentleman with a recent history of recurrent phimosis with balanitis.  He has had several ER visits related to this in the past year.  He presented for evaluation, was noted to have a phimotic ring with some difficulty retracting his foreskin interfering with hygiene.  He had been recently tested for diabetes and this was negative per report.  Options were discussed with management including observation with p.r.n. treatment of this balanitis versus dorsal slit versus circumcision.  He wished to proceed with the latter.  Informed consent was obtained and placed in medical record.  PROCEDURE IN DETAIL:  The patient being Cody [name] was verified. Procedure being circumcision was confirmed.  Procedure was carried out. Time-out was performed.  Intravenous antibiotics administered.  General LMA anesthesia was introduced.  The patient was placed in supine position.  Sterile field was created by prepping and draping the patient's penis, perineum, proximal thighs using iodine x3.  The foreskin was retracted and prepped as well.  A 16-French Foley catheter was placed for manipulation of the penis.  The proximal incision line was marked corresponding to the area of the unstretched corona of the glans.  This was marked and incised circumferentially.  Foreskin was retracted and the distal line  was incised approximately 8 mm proximal to the corona of the glans circumferentially sparing the frenulum in the 12 o'clock position.  Cold scissors were used to connect these lines. Four hemostats were applied and the redundant foreskin was released from the underlying tissue using cautery.  This was set aside for permanent pathology.  Additional hemostasis was achieved using point coagulation current as well as oversewing the figure-of-eight superficial veins on the dorsum resulted in excellent hemostasis.  Next 4 quadrant sutures were applied reapproximating the foreskin and with a U-stitch at the frenulum and intervening running 4-0 chromic suture was applied circumferentially. This resulted in excellent hemostasis and cosmesis.  Xeroform dressing and Coban were applied.  Catheter was removed.  Procedure was then terminated.  The patient tolerated the procedure well.  There were no immediate periprocedural complications. The patient was taken to postanesthesia care unit in stable condition.          ______________________________ Sebastian Ache, MD     TM/MEDQ  D:  03/23/2013  T:  03/23/2013  Job:  [id number]

## 2013-03-23 NOTE — Progress Notes (Signed)
Patient and family are waiting for taxi.  Dr. Berneice Heinrich ok with 75cc urine output to discharge patient.Marland Kitchen

## 2013-03-23 NOTE — Op Note (Deleted)
NAME:  [name], Cody                ACCOUNT NO.:  [id number]  MEDICAL RECORD NO.:  [id number]  LOCATION:                               FACILITY:  [facility]  PHYSICIAN:  Baylee Campus, MD     DATE OF BIRTH:  02/18/1991  DATE OF PROCEDURE: 03/23/2013 DATE OF DISCHARGE:  03/23/2013                              OPERATIVE REPORT   DIAGNOSIS:  Phimosis with recurrent balanitis.  PROCEDURE: 1. Circumcision. 2. Penile block.  COMPLICATIONS:  None.  SPECIMEN:  Phimotic foreskin, for permanant pathology.  ESTIMATED BLOOD LOSS:  Less than 20 mL.  DRAINS:  None.  INDICATION:  [name] is a pleasant young gentleman with a recent history of recurrent phimosis with balanitis.  He has had several ER visits related to this in the past year.  He presented for evaluation, was noted to have a phimotic ring with some difficulty retracting his foreskin interfering with hygiene.  He had been recently tested for diabetes and this was negative per report.  Options were discussed with management including observation with p.r.n. treatment of this balanitis versus dorsal slit versus circumcision.  He wished to proceed with the latter.  Informed consent was obtained and placed in medical record.  PROCEDURE IN DETAIL:  The patient being [name] was verified. Procedure being circumcision was confirmed.  Procedure was carried out. Time-out was performed.  Intravenous antibiotics administered.  General LMA anesthesia was introduced.  The patient was placed in supine position.  Sterile field was created by prepping and draping the patient's penis, perineum, proximal thighs using iodine x3.  The foreskin was retracted and prepped as well.  A 16-French Foley catheter was placed for manipulation of the penis.  The proximal incision line was marked corresponding to the area of the unstretched corona of the glans.  This was marked and incised circumferentially.  Foreskin was retracted and the distal line  was incised approximately 8 mm proximal to the corona of the glans circumferentially sparing the frenulum in the 12 o'clock position.  Cold scissors were used to connect these lines. Four hemostats were applied and the redundant foreskin was released from the underlying tissue using cautery.  This was set aside for permanent pathology.  Additional hemostasis was achieved using point coagulation current as well as oversewing the figure-of-eight superficial veins on the dorsum resulted in excellent hemostasis.  Next 4 quadrant sutures were applied reapproximating the foreskin and with a U-stitch at the frenulum and intervening running 4-0 chromic suture was applied circumferentially. This resulted in excellent hemostasis and cosmesis.  Xeroform dressing and Coban were applied.  Catheter was removed.  Procedure was then terminated.  The patient tolerated the procedure well.  There were no immediate periprocedural complications. The patient was taken to postanesthesia care unit in stable condition.          ______________________________ Sherwood Castilla, MD     TM/MEDQ  D:  03/23/2013  T:  03/23/2013  Job:  [id number] 

## 2013-03-23 NOTE — H&P (Signed)
Bence Trapp is an 22 y.o. male.    Chief Complaint: Pre-Op Circumcision  HPI:   1- Phimosis / recurrent balanitis- Pt not circumcised. He has had several past episodes of balanitis which has lead to some painful tightning of his foreskin that interferes wtih hygiene and intercourse. He has had several ER visits in past year for this. Non- diabetic, states he had fasting labs previously testing for this.  PMH sig for GERD. No surgeries. No CV disease.  Today Isiah is seen to proceed with circumcision.  Past Medical History  Diagnosis Date  . Asthma     childhood    History reviewed. No pertinent past surgical history.  History reviewed. No pertinent family history. Social History:  reports that he has never smoked. He does not have any smokeless tobacco history on file. He reports that he does not drink alcohol or use illicit drugs.  Allergies:  Allergies  Allergen Reactions  . Penicillins Other (See Comments)    unknown  . Zyrtec (Cetirizine)     "Heart speeds up"    No prescriptions prior to admission    No results found for this or any previous visit (from the past 48 hour(s)). No results found.  Review of Systems  Constitutional: Negative.  Negative for fever and chills.  HENT: Negative.   Eyes: Negative.   Respiratory: Negative.   Cardiovascular: Negative.   Gastrointestinal: Negative.   Genitourinary: Negative.   Musculoskeletal: Negative.   Skin: Negative.   Neurological: Negative.   Endo/Heme/Allergies: Negative.   Psychiatric/Behavioral: Negative.     Height 5\' 11"  (1.803 m), weight 65.772 kg (145 lb). Physical Exam  Constitutional: He is oriented to person, place, and time. He appears well-developed and well-nourished.  HENT:  Head: Normocephalic and atraumatic.  Eyes: EOM are normal. Pupils are equal, round, and reactive to light.  Neck: Normal range of motion. Neck supple.  Cardiovascular: Normal rate.   Respiratory: Effort normal.  GI: Soft.  Bowel sounds are normal.  Genitourinary: Penis normal.  Moderate phimosis  Musculoskeletal: Normal range of motion.  Neurological: He is alert and oriented to person, place, and time.  Skin: Skin is warm and dry.  Psychiatric: He has a normal mood and affect. His behavior is normal. Judgment and thought content normal.     Assessment/Plan    1- Phimosis - We re- discussed the natural history of phimosis with progressive scarring / tightening of the foreskin with phimotic ring which can be painful and interfere with sexual function as well as hygiene. We also discussed the increased risk of penile cancer in patients with chronic phimosis and poor hygiene. I mentioned that the condition is exclusive to men without prior adequate circumcision and is especially prominent in diabetics.  We discussed management options of local therapy (steroid creams, etc.) and surgical therapy with dorsal slit versus circumcision. We re-discussed risks of circumcision including poor cosmesis ("taking too much"), decreased penile sensation and sexual pleasure, wound problems as well as bleeding and rare risks including DVT, PE, MI, CVA, Mortality.   After consideration of options, the patient wishes to proceed with elective circumcision today.   Kensley Lares 03/23/2013, 6:26 AM

## 2013-03-23 NOTE — Anesthesia Procedure Notes (Signed)
Procedure Name: LMA Insertion Date/Time: 03/23/2013 8:28 AM Performed by: Renella Cunas D Pre-anesthesia Checklist: Patient identified, Emergency Drugs available, Suction available and Patient being monitored Patient Re-evaluated:Patient Re-evaluated prior to inductionOxygen Delivery Method: Circle System Utilized Preoxygenation: Pre-oxygenation with 100% oxygen Intubation Type: IV induction Ventilation: Mask ventilation without difficulty LMA: LMA inserted LMA Size: 4.0 Number of attempts: 1 Airway Equipment and Method: bite block Placement Confirmation: positive ETCO2 Tube secured with: Tape Dental Injury: Teeth and Oropharynx as per pre-operative assessment

## 2013-03-23 NOTE — Anesthesia Preprocedure Evaluation (Addendum)
Anesthesia Evaluation  Patient identified by MRN, date of birth, ID band Patient awake    Reviewed: Allergy & Precautions, H&P , NPO status , Patient's Chart, lab work & pertinent test results, reviewed documented beta blocker date and time   Airway Mallampati: II TM Distance: >3 FB Neck ROM: full    Dental no notable dental hx.    Pulmonary neg pulmonary ROS,  breath sounds clear to auscultation  Pulmonary exam normal       Cardiovascular Exercise Tolerance: Good negative cardio ROS  Rhythm:regular Rate:Normal     Neuro/Psych negative neurological ROS  negative psych ROS   GI/Hepatic negative GI ROS, Neg liver ROS,   Endo/Other  negative endocrine ROS  Renal/GU negative Renal ROS  negative genitourinary   Musculoskeletal   Abdominal   Peds  Hematology negative hematology ROS (+)   Anesthesia Other Findings   Reproductive/Obstetrics negative OB ROS                          Anesthesia Physical Anesthesia Plan  ASA: I  Anesthesia Plan: General   Post-op Pain Management:    Induction:   Airway Management Planned: LMA  Additional Equipment:   Intra-op Plan:   Post-operative Plan:   Informed Consent: I have reviewed the patients History and Physical, chart, labs and discussed the procedure including the risks, benefits and alternatives for the proposed anesthesia with the patient or authorized representative who has indicated his/her understanding and acceptance.   Dental Advisory Given  Plan Discussed with: CRNA  Anesthesia Plan Comments:        Anesthesia Quick Evaluation

## 2013-03-23 NOTE — Brief Op Note (Signed)
03/23/2013  9:08 AM  PATIENT:  Cody [name]  22 y.o. male  PRE-OPERATIVE DIAGNOSIS:  PHIMOSIS, RECURRENT BALANITIS  POST-OPERATIVE DIAGNOSIS:  PHIMOSIS, RECURRENT BALANITIS  PROCEDURE:  Procedure(s): cir  (N/A)  SURGEON:  Surgeon(s) and Role:    * Sebastian Ache, MD - Primary  PHYSICIAN ASSISTANT:   ASSISTANTS: none   ANESTHESIA:   local and general  EBL:  Total I/O In: 100 [I.V.:100] Out: -   BLOOD ADMINISTERED:none  DRAINS: none   LOCAL MEDICATIONS USED:  LIDOCAINE   SPECIMEN:  Source of Specimen:  foreskin  DISPOSITION OF SPECIMEN:  PATHOLOGY  COUNTS:  YES  TOURNIQUET:  * No tourniquets in log *  DICTATION: .Other Dictation: Dictation Number [id number]  PLAN OF CARE: Discharge to home after PACU  PATIENT DISPOSITION:  PACU - hemodynamically stable.   Delay start of Pharmacological VTE agent (>24hrs) due to surgical blood loss or risk of bleeding: no

## 2013-03-24 ENCOUNTER — Encounter (HOSPITAL_BASED_OUTPATIENT_CLINIC_OR_DEPARTMENT_OTHER): Payer: Self-pay | Admitting: Urology

## 2013-03-25 ENCOUNTER — Encounter (HOSPITAL_COMMUNITY): Payer: Self-pay | Admitting: *Deleted

## 2013-03-25 ENCOUNTER — Emergency Department (HOSPITAL_COMMUNITY)
Admission: EM | Admit: 2013-03-25 | Discharge: 2013-03-25 | Disposition: A | Discharge status: Home / Self Care | Payer: Medicaid Other | Attending: Emergency Medicine | Admitting: Emergency Medicine

## 2013-03-25 DIAGNOSIS — R072 Precordial pain: Secondary | ICD-10-CM | POA: Insufficient documentation

## 2013-03-25 DIAGNOSIS — R0602 Shortness of breath: Secondary | ICD-10-CM | POA: Insufficient documentation

## 2013-03-25 DIAGNOSIS — Y838 Other surgical procedures as the cause of abnormal reaction of the patient, or of later complication, without mention of misadventure at the time of the procedure: Secondary | ICD-10-CM | POA: Insufficient documentation

## 2013-03-25 DIAGNOSIS — IMO0002 Reserved for concepts with insufficient information to code with codable children: Secondary | ICD-10-CM | POA: Insufficient documentation

## 2013-03-25 DIAGNOSIS — J45909 Unspecified asthma, uncomplicated: Secondary | ICD-10-CM | POA: Insufficient documentation

## 2013-03-25 DIAGNOSIS — Z79899 Other long term (current) drug therapy: Secondary | ICD-10-CM | POA: Insufficient documentation

## 2013-03-25 DIAGNOSIS — T819XXA Unspecified complication of procedure, initial encounter: Secondary | ICD-10-CM

## 2013-03-25 LAB — CBC WITH DIFFERENTIAL/PLATELET
Basophils Absolute: 0 10*3/uL (ref 0.0–0.1)
Eosinophils Absolute: 0.1 10*3/uL (ref 0.0–0.7)
Lymphocytes Relative: 54 % — ABNORMAL HIGH (ref 12–46)
Lymphs Abs: 3.4 10*3/uL (ref 0.7–4.0)
MCH: 30.9 pg (ref 26.0–34.0)
Neutrophils Relative %: 37 % — ABNORMAL LOW (ref 43–77)
Platelets: 257 10*3/uL (ref 150–400)
RBC: 4.59 MIL/uL (ref 4.22–5.81)
RDW: 13.4 % (ref 11.5–15.5)
WBC: 6.2 10*3/uL (ref 4.0–10.5)

## 2013-03-25 NOTE — ED Notes (Signed)
Pt comfortable with d/c and f/u instructions. 

## 2013-03-25 NOTE — ED Provider Notes (Signed)
History     CSN: [id number]  Arrival date & time 03/25/13  1819   First MD Initiated Contact with Patient 03/25/13 2034      Chief Complaint  Patient presents with  . Chest Pain  . Groin Swelling    penile swelling     HPI Pt was circumcised on Wed. Today he removed the dressing and he penis began to swell. Shortly after this, he began experiencing sternal chest pain and sob. He called his Dr who told him to have surgical site assessed in ED.  Past Medical History  Diagnosis Date  . Asthma     childhood    Past Surgical History  Procedure Laterality Date  . Circumcision N/A 03/23/2013    Procedure: Circumcision  ;  Surgeon: Sebastian Ache, MD;  Location: [facility];  Service: Urology;  Laterality: N/A;  with block    No family history on file.  History  Substance Use Topics  . Smoking status: Never Smoker   . Smokeless tobacco: Not on file  . Alcohol Use: No      Review of Systems  Allergies  Penicillins and Zyrtec  Home Medications   Current Outpatient Rx  Name  Route  Sig  Dispense  Refill  . aspirin-acetaminophen-caffeine (EXCEDRIN MIGRAINE) 250-250-65 MG per tablet   Oral   Take 1 tablet by mouth every 6 (six) hours as needed for pain.   30 tablet   0   . loratadine (CLARITIN) 10 MG tablet   Oral   Take 1 tablet (10 mg total) by mouth daily.   30 tablet   0   . sennosides-docusate sodium (SENOKOT-S) 8.6-50 MG tablet   Oral   Take 1 tablet by mouth 2 (two) times daily. While taking pain meds to prevent constipation   30 tablet   0   . traMADol (ULTRAM) 50 MG tablet   Oral   Take 1 tablet (50 mg total) by mouth every 6 (six) hours as needed for pain.   30 tablet   0     BP 120/66  Pulse 54  Temp(Src) 97.5 F (36.4 C) (Oral)  Resp 23  Ht 5\' 11"  (1.803 m)  Wt 153 lb (69.4 kg)  BMI 21.35 kg/m2  SpO2 100%  Physical Exam  Nursing note and vitals reviewed. Constitutional: He is oriented to person, place, and time. He  appears well-developed and well-nourished. No distress.  HENT:  Head: Normocephalic and atraumatic.  Eyes: Pupils are equal, round, and reactive to light.  Neck: Normal range of motion.  Cardiovascular: Normal rate and intact distal pulses.   Pulmonary/Chest: No respiratory distress.  Abdominal: Normal appearance. He exhibits no distension.  Genitourinary:  Recently circumcised the glans has slight amount of swelling no significant redness or drainage.  No significant pain.  Swelling appears normal for postop circumcision.  Patient given reassurance  Musculoskeletal: Normal range of motion.  Neurological: He is alert and oriented to person, place, and time. No cranial nerve deficit.  Skin: Skin is warm and dry. No rash noted.  Psychiatric: He has a normal mood and affect. His behavior is normal.    ED Course  Procedures (including critical care time)  Date: 03/25/2013  Rate: 66  Rhythm: normal sinus rhythm  QRS Axis: normal  Intervals: normal  ST/T Wave abnormalities: normal  Conduction Disutrbances: none  Narrative Interpretation: unremarkable     Labs Reviewed  CBC WITH DIFFERENTIAL - Abnormal; Notable for the following:  Neutrophils Relative 37 (*)    Lymphocytes Relative 54 (*)    All other components within normal limits   No results found.   1. Circumcision complication, initial encounter       MDM          Nelia Shi, MD 03/25/13 2238

## 2013-03-25 NOTE — ED Notes (Signed)
Pt was circumcised on Wed.  Today he removed the dressing and he penis began to swell.  Shortly after this, he began experiencing sternal chest pain and sob.  He called his Dr who told him to have surgical site assessed in ED.

## 2013-06-29 ENCOUNTER — Encounter (HOSPITAL_COMMUNITY): Payer: Self-pay | Admitting: Emergency Medicine

## 2013-06-29 ENCOUNTER — Emergency Department (HOSPITAL_COMMUNITY)
Admission: EM | Admit: 2013-06-29 | Discharge: 2013-06-29 | Disposition: A | Discharge status: Home / Self Care | Payer: Medicaid Other | Attending: Emergency Medicine | Admitting: Emergency Medicine

## 2013-06-29 DIAGNOSIS — J45909 Unspecified asthma, uncomplicated: Secondary | ICD-10-CM | POA: Insufficient documentation

## 2013-06-29 DIAGNOSIS — R209 Unspecified disturbances of skin sensation: Secondary | ICD-10-CM | POA: Insufficient documentation

## 2013-06-29 DIAGNOSIS — Z880 Allergy status to penicillin: Secondary | ICD-10-CM | POA: Insufficient documentation

## 2013-06-29 DIAGNOSIS — M79604 Pain in right leg: Secondary | ICD-10-CM

## 2013-06-29 DIAGNOSIS — M25569 Pain in unspecified knee: Secondary | ICD-10-CM | POA: Insufficient documentation

## 2013-06-29 NOTE — ED Notes (Signed)
PT. REPORTS RIGHT LOWER LEG " STIFFNESS" FOR 2 YEARS , DENIES INJURY , AMBULATORY.

## 2013-06-29 NOTE — ED Provider Notes (Signed)
History  This chart was scribed for non-physician practitioner working with Joya Gaskins, MD by Greggory Stallion, ED scribe. This patient was seen in room TR08C/TR08C and the patient's care was started at 10:06 PM.  CSN: [id number] Arrival date & time 06/29/13  2116   Chief Complaint  Patient presents with  . Leg Pain   The history is provided by the patient. No language interpreter was used.    HPI Comments: Cody [name] is a 22 y.o. male who presents to the Emergency Department complaining of gradual onset, intermittent right lower leg stiffness and numbness of the sole of his foot that started 2 years ago. Pt denies injury.  States usually sx get worse when he is stressed out  Pt states there is a h/o blood clots in his family so he wanted to make sure he didn't have one. Pt denies and calf pain or swelling. Previously saw a neurologist about this and had nerve conduction studies done which were normal.  No personal hx of DVT.  No recent travel.  Past Medical History  Diagnosis Date  . Asthma     childhood   Past Surgical History  Procedure Laterality Date  . Circumcision N/A 03/23/2013    Procedure: Circumcision  ;  Surgeon: Sebastian Ache, MD;  Location: [facility];  Service: Urology;  Laterality: N/A;  with block   No family history on file. History  Substance Use Topics  . Smoking status: Never Smoker   . Smokeless tobacco: Not on file  . Alcohol Use: No    Review of Systems  Musculoskeletal: Positive for arthralgias.  All other systems reviewed and are negative.    Allergies  Penicillins and Zyrtec  Home Medications   Current Outpatient Rx  Name  Route  Sig  Dispense  Refill  . aspirin-acetaminophen-caffeine (EXCEDRIN MIGRAINE) 250-250-65 MG per tablet   Oral   Take 1 tablet by mouth every 6 (six) hours as needed for pain.   30 tablet   0   . loratadine (CLARITIN) 10 MG tablet   Oral   Take 1 tablet (10 mg total) by mouth daily.   30  tablet   0   . sennosides-docusate sodium (SENOKOT-S) 8.6-50 MG tablet   Oral   Take 1 tablet by mouth 2 (two) times daily. While taking pain meds to prevent constipation   30 tablet   0   . traMADol (ULTRAM) 50 MG tablet   Oral   Take 1 tablet (50 mg total) by mouth every 6 (six) hours as needed for pain.   30 tablet   0    BP 133/68  Pulse 62  Temp(Src) 97.6 F (36.4 C) (Oral)  Resp 16  SpO2 100%  Physical Exam  Nursing note and vitals reviewed. Constitutional: He is oriented to person, place, and time. He appears well-developed and well-nourished.  HENT:  Head: Normocephalic and atraumatic.  Mouth/Throat: Oropharynx is clear and moist.  Eyes: Conjunctivae and EOM are normal. Pupils are equal, round, and reactive to light.  Neck: Normal range of motion. Neck supple.  Cardiovascular: Normal rate, regular rhythm and normal heart sounds.   Pulmonary/Chest: Effort normal and breath sounds normal.  Musculoskeletal: Normal range of motion.       Right lower leg: He exhibits no tenderness, no bony tenderness, no swelling, no edema, no deformity and no laceration.  No calf tenderness, asymmetry, or palpable cord; negative Homan's sign; full ROM of leg, strong distal pulse, sensation intact  Neurological: He is alert and oriented to person, place, and time.  Skin: Skin is warm and dry.  Psychiatric: He has a normal mood and affect.    ED Course  Procedures (including critical care time)  DIAGNOSTIC STUDIES: Oxygen Saturation is 100% on RA, normal by my interpretation.    COORDINATION OF CARE: 10:26 PM-Discussed treatment plan with pt at bedside and pt agreed to plan. Advised pt to follow up with orthopaedics.   Labs Reviewed - No data to display No results found.  1. Right leg pain     MDM   No signs/sx concerning for DVT at this time. Intermittent right lower extremity stiffness and solar numbness without apparent cause. Referral to orthopedics- Dr. Dion Saucier. Offered  pain medication, patient declined.  Return precautions advised.  I personally performed the services described in this documentation, which was scribed in my presence. The recorded information has been reviewed and is accurate.   Garlon Hatchet, PA-C 06/30/13 0007

## 2013-07-02 NOTE — ED Provider Notes (Signed)
Medical screening examination/treatment/procedure(s) were performed by non-physician practitioner and as supervising physician I was immediately available for consultation/collaboration.   Gabreal Worton W Kensli Bowley, MD 07/02/13 0722 

## 2013-12-05 DIAGNOSIS — G589 Mononeuropathy, unspecified: Secondary | ICD-10-CM | POA: Insufficient documentation

## 2013-12-05 DIAGNOSIS — Z880 Allergy status to penicillin: Secondary | ICD-10-CM | POA: Insufficient documentation

## 2013-12-05 DIAGNOSIS — J45909 Unspecified asthma, uncomplicated: Secondary | ICD-10-CM | POA: Insufficient documentation

## 2013-12-06 ENCOUNTER — Encounter (HOSPITAL_COMMUNITY): Payer: Self-pay | Admitting: Emergency Medicine

## 2013-12-06 ENCOUNTER — Emergency Department (HOSPITAL_COMMUNITY)
Admission: EM | Admit: 2013-12-06 | Discharge: 2013-12-06 | Disposition: A | Discharge status: Home / Self Care | Payer: Medicaid Other | Attending: Emergency Medicine | Admitting: Emergency Medicine

## 2013-12-06 DIAGNOSIS — G629 Polyneuropathy, unspecified: Secondary | ICD-10-CM

## 2013-12-06 NOTE — ED Provider Notes (Signed)
Medical screening examination/treatment/procedure(s) were performed by non-physician practitioner and as supervising physician I was immediately available for consultation/collaboration.  EKG Interpretation   None         Zamaria Brazzle, MD 12/06/13 0748 

## 2013-12-06 NOTE — ED Notes (Signed)
Patient had a tooth pulled Friday before Thanksgiving and his chin is still numb

## 2013-12-06 NOTE — ED Provider Notes (Signed)
CSN: [id number]     Arrival date & time 12/05/13  2341 History   First MD Initiated Contact with Patient 12/06/13 0053     Chief Complaint  Patient presents with  . Numbness   (Consider location/radiation/quality/duration/timing/severity/associated sxs/prior Treatment) HPI History provided by pt.   Pt had three teeth extracted ~1 month ago.  Has had numbness of right side of chin ever since.  No associated fever, pain, skin changes, other paresthesias.  He followed up with his dentist, who instructed him to give it more time.  He also referred him to another dentist, but patient missed his appt.  No pertinent PMH. Past Medical History  Diagnosis Date  . Asthma     childhood   Past Surgical History  Procedure Laterality Date  . Circumcision N/A 03/23/2013    Procedure: Circumcision  ;  Surgeon: Sebastian Ache, MD;  Location: [facility];  Service: Urology;  Laterality: N/A;  with block   History reviewed. No pertinent family history. History  Substance Use Topics  . Smoking status: Never Smoker   . Smokeless tobacco: Not on file  . Alcohol Use: No    Review of Systems  All other systems reviewed and are negative.    Allergies  Penicillins and Zyrtec  Home Medications   Current Outpatient Rx  Name  Route  Sig  Dispense  Refill  . ibuprofen (ADVIL,MOTRIN) 200 MG tablet   Oral   Take 200 mg by mouth every 6 (six) hours as needed for pain.          BP 118/67  Pulse 61  Temp(Src) 97.8 F (36.6 C) (Oral)  Resp 18  Wt 164 lb (74.39 kg)  SpO2 97% Physical Exam  Nursing note and vitals reviewed. Constitutional: He is oriented to person, place, and time. He appears well-developed and well-nourished. No distress.  HENT:  Head: Normocephalic and atraumatic.  No skin changes.  No edema.  No ttp.  Decreased sensation right anterior chin only.  No changes to buccal mucosa or gingiva.  Full ROM jaw.    Eyes:  Normal appearance  Neck: Normal range of motion.   Pulmonary/Chest: Effort normal.  Musculoskeletal: Normal range of motion.  Neurological: He is alert and oriented to person, place, and time.  Psychiatric: He has a normal mood and affect. His behavior is normal.    ED Course  Procedures (including critical care time) Labs Review Labs Reviewed - No data to display Imaging Review No results found.  EKG Interpretation   None       MDM   1. Neuropathy    22yo M presents w/ right chin numbness since he received a dental block for multiple dental extractions one month ago.  No significant exam findings w/ exception of localized sensory deficit.  Recommended f/u w/ his dentist.      Otilio Miu, PA-C 12/06/13 [phone]

## 2014-03-17 ENCOUNTER — Encounter (HOSPITAL_COMMUNITY): Payer: Self-pay | Admitting: Emergency Medicine

## 2014-03-17 ENCOUNTER — Emergency Department (HOSPITAL_COMMUNITY)
Admission: EM | Admit: 2014-03-17 | Discharge: 2014-03-17 | Disposition: A | Discharge status: Home / Self Care | Payer: Medicaid Other | Attending: Emergency Medicine | Admitting: Emergency Medicine

## 2014-03-17 DIAGNOSIS — J45909 Unspecified asthma, uncomplicated: Secondary | ICD-10-CM | POA: Insufficient documentation

## 2014-03-17 DIAGNOSIS — M25579 Pain in unspecified ankle and joints of unspecified foot: Secondary | ICD-10-CM | POA: Insufficient documentation

## 2014-03-17 DIAGNOSIS — Z880 Allergy status to penicillin: Secondary | ICD-10-CM | POA: Insufficient documentation

## 2014-03-17 DIAGNOSIS — M79673 Pain in unspecified foot: Secondary | ICD-10-CM

## 2014-03-17 MED ORDER — IBUPROFEN 800 MG PO TABS: 800.0000 mg | ORAL_TABLET | Freq: Three times a day (TID) | ORAL | Status: DC

## 2014-03-17 NOTE — ED Provider Notes (Signed)
CSN: [id number]     Arrival date & time 03/17/14  1218 History  This chart was scribed for non-physician practitioner, Harvie Heck, PA-C, working with Richarda Blade, MD by Roe Coombs, ED Scribe. This patient was seen in room TR10C/TR10C and the patient's care was started at 1:57 PM.    Chief Complaint  Patient presents with  . Foot Pain     The history is provided by the patient. No language interpreter was used.    HPI Comments: Cody [name] is a 23 y.o. male who presents to the Emergency Department complaining of intermittent right foot pain on the plantar surface onset 2 months ago. Patient states that pain has become more frequent in the last 1 day. Pain is worse with long periods of ambulation and weight bearing. He further reports that 2 months ago he stepped on a piece of glass and he thinks that there may still be a piece in his foot.  He denies previous cuts or wounds to his foot.  He reports he has tried to dig the glass out without success. There is no numbness or weakness in the extremities. There is no redness of the foot. No fever. Patient has no chronic medical conditions. He does not smoke.   Past Medical History  Diagnosis Date  . Asthma     childhood   Past Surgical History  Procedure Laterality Date  . Circumcision N/A 03/23/2013    Procedure: Circumcision  ;  Surgeon: Alexis Frock, MD;  Location: [facility];  Service: Urology;  Laterality: N/A;  with block   History reviewed. No pertinent family history. History  Substance Use Topics  . Smoking status: Never Smoker   . Smokeless tobacco: Not on file  . Alcohol Use: No    Review of Systems  Constitutional: Negative for fever.  Musculoskeletal: Positive for arthralgias.  Neurological: Negative for weakness and numbness.      Allergies  Penicillins and Zyrtec  Home Medications  No current outpatient prescriptions on file. Triage Vitals: BP 132/74  Pulse 57  Temp(Src) 98.1 F (36.7  C) (Oral)  Resp 18  Wt 164 lb 5 oz (74.532 kg)  SpO2 100% Physical Exam  Nursing note and vitals reviewed. Constitutional: He is oriented to person, place, and time. He appears well-developed and well-nourished. No distress.  HENT:  Head: Normocephalic and atraumatic.  Eyes: EOM are normal.  Neck: Neck supple.  Pulmonary/Chest: Effort normal. No respiratory distress.  Musculoskeletal: Normal range of motion.  Neurological: He is alert and oriented to person, place, and time.  Skin: Skin is warm and dry. No erythema.  Small raised circular callous to the base of the right second toe with a central depression/lesion without erythema or drainage.  Psychiatric: He has a normal mood and affect. His behavior is normal.    ED Course  Procedures (including critical care time) EMERGENCY DEPARTMENT US SOFT TISSUE INTERPRETATION "Study: Limited Ultrasound of the noted body part in comments below"  INDICATIONS: Other (refer to comments) Possible FB Multiple views of the body part are obtained with a multi-frequency linear probe  PERFORMED BY:  Myself  IMAGES ARCHIVED?: No  SIDE:Right   BODY PART:Other soft tisse (comment in note) left foot  FINDINGS: Other mltiple small opaque round masses.    LIMITATIONS:    INTERPRETATION:  No cellulitis noted  COMMENT:    COORDINATION OF CARE: 2:07 PM- Patient informed of current plan for treatment and evaluation and agrees with plan at this  time.     Labs Review Labs Reviewed - No data to display   Imaging Review No results found.   MDM   Final diagnoses:  Foot pain   Pt with foot pain. PE consistent with likely plantar wart. US shows multiple small round opaque in soft tissue.  Discussed OTC treatment of planar warts and following up with a podiatrist for further evaluation.  Discussed imaging results, and treatment plan with the patient. Return precautions given. Reports understanding and no other concerns at this time.   Patient is stable for discharge at this time.  Meds given in ED:  Medications - No data to display  Discharge Medication List as of 03/17/2014  2:11 PM    START taking these medications   Details  ibuprofen (ADVIL,MOTRIN) 800 MG tablet Take 1 tablet (800 mg total) by mouth 3 (three) times daily., Starting 03/17/2014, Until Discontinued, Print       I personally performed the services described in this documentation, which was scribed in my presence. The recorded information has been reviewed and is accurate.   Lorrine Kin, PA-C 03/18/14 1450

## 2014-03-17 NOTE — ED Notes (Signed)
Pt reports that about 2 months ago he stepped on a piece of glass with his right foot. He thinks that the area is now infected.

## 2014-03-17 NOTE — Discharge Instructions (Signed)
Call for a follow up appointment with a Family or Primary Care Provider.  Call a podiatrist for further evaluation of your foot pain, and possible planter wart. Return if Symptoms worsen.   Take medication as prescribed.

## 2014-03-19 NOTE — ED Provider Notes (Signed)
Medical screening examination/treatment/procedure(s) were performed by non-physician practitioner and as supervising physician I was immediately available for consultation/collaboration.   EKG Interpretation None       Richarda Blade, MD 03/19/14 2111

## 2014-04-27 ENCOUNTER — Emergency Department (HOSPITAL_COMMUNITY)
Admission: EM | Admit: 2014-04-27 | Discharge: 2014-04-27 | Disposition: A | Discharge status: Home / Self Care | Payer: Medicaid Other | Attending: Emergency Medicine | Admitting: Emergency Medicine

## 2014-04-27 ENCOUNTER — Emergency Department (HOSPITAL_COMMUNITY): Payer: Medicaid Other

## 2014-04-27 ENCOUNTER — Encounter (HOSPITAL_COMMUNITY): Payer: Self-pay | Admitting: Emergency Medicine

## 2014-04-27 DIAGNOSIS — Z880 Allergy status to penicillin: Secondary | ICD-10-CM | POA: Insufficient documentation

## 2014-04-27 DIAGNOSIS — Y929 Unspecified place or not applicable: Secondary | ICD-10-CM | POA: Insufficient documentation

## 2014-04-27 DIAGNOSIS — W2209XA Striking against other stationary object, initial encounter: Secondary | ICD-10-CM | POA: Insufficient documentation

## 2014-04-27 DIAGNOSIS — J45909 Unspecified asthma, uncomplicated: Secondary | ICD-10-CM | POA: Insufficient documentation

## 2014-04-27 DIAGNOSIS — Y939 Activity, unspecified: Secondary | ICD-10-CM | POA: Insufficient documentation

## 2014-04-27 DIAGNOSIS — S0990XA Unspecified injury of head, initial encounter: Secondary | ICD-10-CM | POA: Insufficient documentation

## 2014-04-27 NOTE — ED Provider Notes (Signed)
CSN: [id number]     Arrival date & time 04/27/14  1251 History   First MD Initiated Contact with Patient 04/27/14 1603     Chief Complaint  Patient presents with  . Head Injury     (Consider location/radiation/quality/duration/timing/severity/associated sxs/prior Treatment) HPI Comments: Patient is 23 year old male who was reaching into the refrigerator and then raised up and struck his occipital area of the head.   He reports no LOC, dizziness, nausea, vomiting, neck pain, weakness, numbness or tingling.  He states no other issues except that he feels like he wants to sleep all the time.  He reports previous history of concussion.  Patient is a 23 y.o. male presenting with head injury. The history is provided by the patient. No language interpreter was used.  Head Injury Location:  Occipital Time since incident:  12 hours Mechanism of injury: direct blow   Pain details:    Quality:  Dull   Severity:  Mild   Timing:  Constant   Progression:  Partially resolved Chronicity:  New Relieved by:  Nothing Worsened by:  Nothing tried Ineffective treatments:  None tried Associated symptoms: headache   Associated symptoms: no blurred vision, no double vision, no focal weakness, no loss of consciousness, no memory loss, no nausea, no neck pain, no numbness and no vomiting     Past Medical History  Diagnosis Date  . Asthma     childhood   Past Surgical History  Procedure Laterality Date  . Circumcision N/A 03/23/2013    Procedure: Circumcision  ;  Surgeon: Alexis Frock, MD;  Location: [facility];  Service: Urology;  Laterality: N/A;  with block   No family history on file. History  Substance Use Topics  . Smoking status: Never Smoker   . Smokeless tobacco: Not on file  . Alcohol Use: No    Review of Systems  Eyes: Negative for blurred vision and double vision.  Gastrointestinal: Negative for nausea and vomiting.  Musculoskeletal: Negative for neck pain.   Neurological: Positive for headaches. Negative for focal weakness, loss of consciousness and numbness.  Psychiatric/Behavioral: Negative for memory loss.  All other systems reviewed and are negative.     Allergies  Penicillins and Zyrtec  Home Medications   Prior to Admission medications   Not on File   BP 120/69  Pulse 62  Temp(Src) 98.1 F (36.7 C) (Oral)  Resp 20  Ht 5\' 11"  (1.803 m)  Wt 165 lb (74.844 kg)  BMI 23.02 kg/m2  SpO2 100% Physical Exam  Nursing note and vitals reviewed. Constitutional: He is oriented to person, place, and time. He appears well-developed and well-nourished.  HENT:  Head: Normocephalic.  Right Ear: External ear normal.  Left Ear: External ear normal.  Nose: Nose normal.  Mouth/Throat: No oropharyngeal exudate.  Mild occipital tenderness to scalp without hematoma, no crepitus.  Eyes: Conjunctivae and EOM are normal. Pupils are equal, round, and reactive to light. No scleral icterus.  Neck: Normal range of motion. No spinous process tenderness and no muscular tenderness present.  Cardiovascular: Normal rate, regular rhythm and normal heart sounds.  Exam reveals no gallop and no friction rub.   No murmur heard. Pulmonary/Chest: Effort normal and breath sounds normal. No respiratory distress. He has no wheezes. He has no rales. He exhibits no tenderness.  Abdominal: Soft. Bowel sounds are normal. He exhibits no distension. There is no tenderness.  Musculoskeletal: Normal range of motion. He exhibits no edema and no tenderness.  Lymphadenopathy:    He has no cervical adenopathy.  Neurological: He is alert and oriented to person, place, and time. No cranial nerve deficit. He exhibits normal muscle tone. Coordination normal.  Skin: Skin is warm and dry. No rash noted. No erythema. No pallor.  Psychiatric: He has a normal mood and affect. His behavior is normal. Judgment and thought content normal.    ED Course  Procedures (including critical  care time) Labs Review Labs Reviewed - No data to display  Imaging Review Ct Head Wo Contrast  04/27/2014   CLINICAL DATA:  Head injury, somnolence.  EXAM: CT HEAD WITHOUT CONTRAST  TECHNIQUE: Contiguous axial images were obtained from the base of the skull through the vertex without intravenous contrast.  COMPARISON:  None.  FINDINGS: The ventricles and sulci are normal. No intraparenchymal hemorrhage, mass effect nor midline shift. No acute large vascular territory infarcts.  No abnormal extra-axial fluid collections. Basal cisterns are patent.  No skull fracture. The included ocular globes and orbital contents are non-suspicious. Mild dysconjugate gaze may be transient. The mastoid aircells and included paranasal sinuses are well-aerated.  IMPRESSION: No acute intracranial process; normal noncontrast CT of the head.   Electronically Signed   By: Elon Alas   On: 04/27/2014 15:17     EKG Interpretation None      MDM   Minor head injury  Patient presents with minor head injury after striking freezer door with back of head.  No alarming signs and CT scan is reassuring.  Patient will be taking tylenol and motrin for the pain  - discharge and return instructions given.    Idalia Needle Joelyn Oms, [place] 04/27/14 [phone]

## 2014-04-27 NOTE — ED Notes (Signed)
Pt reports hitting back of head on refrigerator last night, no LOC. Knot noted to back of head. Pt now reports "I just keep falling asleep." Pt denies HA, blurred vision, syncope. AO x4, PERRLA 46mm. Ambulatory to triage. NAD

## 2014-04-27 NOTE — ED Notes (Signed)
Patient transported to CT 

## 2014-04-27 NOTE — Discharge Instructions (Signed)
Head Injury, Adult  You have received a head injury. It does not appear serious at this time. Headaches and vomiting are common following head injury. It should be easy to awaken from sleeping. Sometimes it is necessary for you to stay in the emergency department for a while for observation. Sometimes admission to the hospital may be needed. After injuries such as yours, most problems occur within the first 24 hours, but side effects may occur up to 7 10 days after the injury. It is important for you to carefully monitor your condition and contact your health care provider or seek immediate medical care if there is a change in your condition.  WHAT ARE THE TYPES OF HEAD INJURIES?  Head injuries can be as minor as a bump. Some head injuries can be more severe. More severe head injuries include:  · A jarring injury to the brain (concussion).  · A bruise of the brain (contusion). This mean there is bleeding in the brain that can cause swelling.  · A cracked skull (skull fracture).  · Bleeding in the brain that collects, clots, and forms a bump (hematoma).  WHAT CAUSES A HEAD INJURY?  A serious head injury is most likely to happen to someone who is in a car wreck and is not wearing a seat belt. Other causes of major head injuries include bicycle or motorcycle accidents, sports injuries, and falls.  HOW ARE HEAD INJURIES DIAGNOSED?  A complete history of the event leading to the injury and your current symptoms will be helpful in diagnosing head injuries. Many times, pictures of the brain, such as CT or MRI are needed to see the extent of the injury. Often, an overnight hospital stay is necessary for observation.   WHEN SHOULD I SEEK IMMEDIATE MEDICAL CARE?   You should get help right away if:  · You have confusion or drowsiness.  · You feel sick to your stomach (nauseous) or have continued, forceful vomiting.  · You have dizziness or unsteadiness that is getting worse.  · You have severe, continued headaches not  relieved by medicine. Only take over-the-counter or prescription medicines for pain, fever, or discomfort as directed by your health care provider.  · You do not have normal function of the arms or legs or are unable to walk.  · You notice changes in the black spots in the center of the colored part of your eye (pupil).  · You have a clear or bloody fluid coming from your nose or ears.  · You have a loss of vision.  During the next 24 hours after the injury, you must stay with someone who can watch you for the warning signs. This person should contact local emergency services (911 in the [place] if you have seizures, you become unconscious, or you are unable to wake up.  HOW CAN I PREVENT A HEAD INJURY IN THE FUTURE?  The most important factor for preventing major head injuries is avoiding motor vehicle accidents.  To minimize the potential for damage to your head, it is crucial to wear seat belts while riding in motor vehicles. Wearing helmets while bike riding and playing collision sports (like football) is also helpful. Also, avoiding dangerous activities around the house will further help reduce your risk of head injury.   WHEN CAN I RETURN TO NORMAL ACTIVITIES AND ATHLETICS?  You should be reevaluated by your health care provider before returning to these activities. If you have any of the following symptoms, you should not return   to activities or contact sports until 1 week after the symptoms have stopped:  · Persistent headache.  · Dizziness or vertigo.  · Poor attention and concentration.  · Confusion.  · Memory problems.  · Nausea or vomiting.  · Fatigue or tire easily.  · Irritability.  · Intolerant of bright lights or loud noises.  · Anxiety or depression.  · Disturbed sleep.  MAKE SURE YOU:   · Understand these instructions.  · Will watch your condition.  · Will get help right away if you are not doing well or get worse.  Document Released: 12/08/2005 Document Revised: 09/28/2013 Document Reviewed:  08/15/2013  [employer]® Patient Information ©2014 [employer].

## 2014-05-01 NOTE — ED Provider Notes (Signed)
Medical screening examination/treatment/procedure(s) were performed by non-physician practitioner and as supervising physician I was immediately available for consultation/collaboration.   EKG Interpretation None        Mervin Kung, MD 05/01/14 0330

## 2015-02-15 ENCOUNTER — Encounter (HOSPITAL_COMMUNITY): Payer: Self-pay

## 2015-02-15 ENCOUNTER — Emergency Department (HOSPITAL_COMMUNITY)
Admission: EM | Admit: 2015-02-15 | Discharge: 2015-02-15 | Disposition: A | Discharge status: Home / Self Care | Payer: Medicaid Other | Attending: Emergency Medicine | Admitting: Emergency Medicine

## 2015-02-15 ENCOUNTER — Emergency Department (HOSPITAL_COMMUNITY): Payer: Medicaid Other

## 2015-02-15 DIAGNOSIS — L259 Unspecified contact dermatitis, unspecified cause: Secondary | ICD-10-CM | POA: Diagnosis not present

## 2015-02-15 DIAGNOSIS — J45901 Unspecified asthma with (acute) exacerbation: Secondary | ICD-10-CM | POA: Insufficient documentation

## 2015-02-15 DIAGNOSIS — R05 Cough: Secondary | ICD-10-CM | POA: Diagnosis present

## 2015-02-15 DIAGNOSIS — Z880 Allergy status to penicillin: Secondary | ICD-10-CM | POA: Insufficient documentation

## 2015-02-15 DIAGNOSIS — J40 Bronchitis, not specified as acute or chronic: Secondary | ICD-10-CM

## 2015-02-15 LAB — URINALYSIS, ROUTINE W REFLEX MICROSCOPIC
Bilirubin Urine: NEGATIVE
GLUCOSE, UA: NEGATIVE mg/dL
Hgb urine dipstick: NEGATIVE
KETONES UR: NEGATIVE mg/dL
LEUKOCYTES UA: NEGATIVE
Nitrite: NEGATIVE
PH: 6 (ref 5.0–8.0)
Protein, ur: NEGATIVE mg/dL
SPECIFIC GRAVITY, URINE: 1.02 (ref 1.005–1.030)
Urobilinogen, UA: 1 mg/dL (ref 0.0–1.0)

## 2015-02-15 MED ORDER — ALBUTEROL SULFATE HFA 108 (90 BASE) MCG/ACT IN AERS
2.0000 | INHALATION_SPRAY | Freq: Once | RESPIRATORY_TRACT | Status: AC
  Administered 2015-02-15: 2 via RESPIRATORY_TRACT
  Filled 2015-02-15: qty 6.7

## 2015-02-15 MED ORDER — ALBUTEROL SULFATE (2.5 MG/3ML) 0.083% IN NEBU
5.0000 mg | INHALATION_SOLUTION | Freq: Once | RESPIRATORY_TRACT | Status: AC
  Administered 2015-02-15: 5 mg via RESPIRATORY_TRACT
  Filled 2015-02-15: qty 6

## 2015-02-15 MED ORDER — PREDNISONE 20 MG PO TABS: ORAL_TABLET | ORAL | Status: DC

## 2015-02-15 MED ORDER — AZITHROMYCIN 250 MG PO TABS: 250.0000 mg | ORAL_TABLET | Freq: Every day | ORAL | Status: DC

## 2015-02-15 NOTE — ED Notes (Signed)
resp therapy at bedside to instruct pt on using inhaler

## 2015-02-15 NOTE — ED Notes (Signed)
Pt presents with 3 week h/o productive cough.  Pt also reports rash to penis x 2-3 weeks.  Pt reports rash is not spreading and is only on penis, located only on head.  Denies any itching.  Pt denies any sexual activity, denies penile drainage, denies dysuria.  Admits to using a different soap a few weeks back.

## 2015-02-15 NOTE — ED Provider Notes (Signed)
CSN: [id number]     Arrival date & time 02/15/15  1251 History   First MD Initiated Contact with Patient 02/15/15 1518     No chief complaint on file.    (Consider location/radiation/quality/duration/timing/severity/associated sxs/prior Treatment) The history is provided by the patient. No language interpreter was used.  Cody [name] is a 24 year old male with past medical history of asthma presenting to the ED with cough, nasal congestion, wheezing does been ongoing for approximately 2-3 weeks. Patient reported that when he coughs, the cough is productive. Reported that he's been using NyQuil with relief. When asked if he is been using an albuterol inhaler, patient reported no. Patient reported that his young children have been having cold-like symptoms as well, and most of the individuals in the house have had upper respiratory infections. Patient reported that he's been having a rash on the glans penis for approximately one month-since mid-January. Reported that there is discoloration of the skin along with bumps. Stated that he noticed this when he used a different soap. Denied being sexually active, drainage, dysuria, penile swelling/discharge/discomfort, testicular pain/swelling, bleeding, itching, pain. Denied blurred vision, sudden loss of vision, hemoptysis, chest pain, shortness of breath, fever, chills, neck pain, neck stiffness, difficulty swallowing, sore throat, nausea, vomiting, abdominal pain, travels, dizziness, numbness, tingling, tongue swelling, throat closing sensation. PCP Dr. Jimmye Norman  Past Medical History  Diagnosis Date  . Asthma     childhood   Past Surgical History  Procedure Laterality Date  . Circumcision N/A 03/23/2013    Procedure: Circumcision  ;  Surgeon: Alexis Frock, MD;  Location: [facility];  Service: Urology;  Laterality: N/A;  with block   History reviewed. No pertinent family history. History  Substance Use Topics  . Smoking status:  Never Smoker   . Smokeless tobacco: Not on file  . Alcohol Use: No    Review of Systems  Constitutional: Negative for fever and chills.  Respiratory: Positive for cough and wheezing. Negative for chest tightness and shortness of breath.   Cardiovascular: Negative for chest pain.  Gastrointestinal: Negative for nausea, vomiting, abdominal pain, diarrhea and constipation.  Genitourinary: Negative for dysuria, hematuria, discharge, penile swelling, scrotal swelling, penile pain and testicular pain.  Musculoskeletal: Negative for back pain, neck pain and neck stiffness.  Skin: Positive for rash (on penis).      Allergies  Penicillins and Zyrtec  Home Medications   Prior to Admission medications   Medication Sig Start Date End Date Taking? Authorizing Provider  DM-Doxylamine-Acetaminophen (NYQUIL COLD & FLU PO) Take 1 tablet by mouth daily as needed (cold symptoms).   Yes Historical Provider, MD  azithromycin (ZITHROMAX) 250 MG tablet Take 1 tablet (250 mg total) by mouth daily. Take first 2 tablets together, then 1 every day until finished. 02/15/15   Sherrel Ploch, PA-C  predniSONE (DELTASONE) 20 MG tablet 3 tabs po day one, then 2 tabs daily x 4 days 02/15/15   Elysse Polidore, PA-C   BP 122/85 mmHg  Pulse 67  Temp(Src) 97.7 F (36.5 C) (Oral)  Resp 16  Ht 5\' 11"  (1.803 m)  Wt 165 lb (74.844 kg)  BMI 23.02 kg/m2  SpO2 98% Physical Exam  Constitutional: He is oriented to person, place, and time. He appears well-developed and well-nourished. No distress.  HENT:  Head: Normocephalic and atraumatic.  Mouth/Throat: Oropharynx is clear and moist. No oropharyngeal exudate.  Eyes: Conjunctivae and EOM are normal. Pupils are equal, round, and reactive to light.  Neck: Normal range of  motion. Neck supple. No tracheal deviation present.  Cardiovascular: Normal rate, regular rhythm and normal heart sounds.  Exam reveals no friction rub.   No murmur heard. Pulses:      Radial pulses  are 2+ on the right side, and 2+ on the left side.  Pulmonary/Chest: Effort normal. No respiratory distress. He has wheezes in the right lower field and the left lower field. He has no rales. He exhibits no tenderness.  Expiratory and inspiratory wheezes to lower lobes bilaterally Patient is able to speak in full sentences without difficulty Negative use of accessory muscles Negative stridor  Abdominal: Soft. Bowel sounds are normal. He exhibits no distension. There is no tenderness. There is no rebound and no guarding.  Genitourinary:  Pelvic Exam: Circumcised. Mild hypopigmentation noted to the glans penis with skin colored papules noted. Negative swelling, erythema, drainage, bleeding noted. Negative testicular swelling, erythema, inflammation noted. Negative penile discharge noted. Negative inguinal lymphadenopathy. Negative testicular pain. Exam chaperoned with nurse, Anne Ng.  Musculoskeletal: Normal range of motion.  Lymphadenopathy:    He has no cervical adenopathy.  Neurological: He is alert and oriented to person, place, and time. No cranial nerve deficit. He exhibits normal muscle tone. Coordination normal. GCS eye subscore is 4. GCS verbal subscore is 5. GCS motor subscore is 6.  Skin: Skin is warm and dry. No rash noted. He is not diaphoretic. No erythema.  Psychiatric: He has a normal mood and affect. His behavior is normal. Thought content normal.  Nursing note and vitals reviewed.   ED Course  Procedures (including critical care time)  Results for orders placed or performed during the hospital encounter of 02/15/15  Urinalysis, Routine w reflex microscopic  Result Value Ref Range   Color, Urine YELLOW YELLOW   APPearance CLEAR CLEAR   Specific Gravity, Urine 1.020 1.005 - 1.030   pH 6.0 5.0 - 8.0   Glucose, UA NEGATIVE NEGATIVE mg/dL   Hgb urine dipstick NEGATIVE NEGATIVE   Bilirubin Urine NEGATIVE NEGATIVE   Ketones, ur NEGATIVE NEGATIVE mg/dL   Protein, ur NEGATIVE  NEGATIVE mg/dL   Urobilinogen, UA 1.0 0.0 - 1.0 mg/dL   Nitrite NEGATIVE NEGATIVE   Leukocytes, UA NEGATIVE NEGATIVE    Labs Review Labs Reviewed  URINALYSIS, ROUTINE W REFLEX MICROSCOPIC  RPR  HIV ANTIBODY (ROUTINE TESTING)  HIV ANTIBODY (ROUTINE TESTING)  HSV 1 ANTIBODY, IGG  HSV 2 ANTIBODY, IGG  GC/CHLAMYDIA PROBE AMP ([place])  GC/CHLAMYDIA PROBE AMP ([place])    Imaging Review Dg Chest 2 View  02/15/2015   CLINICAL DATA:  Shortness of breath and chest pain  EXAM: CHEST  2 VIEW  COMPARISON:  None.  FINDINGS: The heart size and mediastinal contours are within normal limits. Both lungs are clear. The visualized skeletal structures are unremarkable.  IMPRESSION: No active cardiopulmonary disease.   Electronically Signed   By: Kerby Moors M.D.   On: 02/15/2015 17:27     EKG Interpretation None        6:32 PM Patient re-assessed. Lungs clear to auscultation. Patient reported that he is feeling better after the nebulizer treatment.  MDM   Final diagnoses:  Bronchitis  Contact dermatitis   Medications  albuterol (PROVENTIL) (2.5 MG/3ML) 0.083% nebulizer solution 5 mg (5 mg Nebulization Given 02/15/15 1611)  albuterol (PROVENTIL HFA;VENTOLIN HFA) 108 (90 BASE) MCG/ACT inhaler 2 puff (2 puffs Inhalation Given 02/15/15 1838)    Filed Vitals:   02/15/15 1600 02/15/15 1611 02/15/15 1831 02/15/15 1838  BP: 106/93  122/85   Pulse: 67  67   Temp:   97.7 F (36.5 C)   TempSrc:   Oral   Resp:   16   Height:      Weight:      SpO2: 100% 100% 100% 98%   Urinalysis unremarkable-negative findings of nitrites, leukocytes, hemoglobin-negative findings of infection. RPR, HIV antibody, GC, you probe pending. Chest x-ray no active cardiopulmonary disease noted. Patient presenting to the ED with cough, nasal congestion, wheezing does been ongoing for approximately 2-3 weeks. Expiratory and inspiratory wheezes noted to the lower lobes bilaterally-nebulizer treatment  administered in ED setting. Negative signs of respiratory distress. Negative findings of pneumonia. Patient able to speak in full sentences without difficulty-negative use of accessory muscles. Negative stridor. Suspicion to be upper respiratory infection. Patient presenting to the ED with penile rashes been ongoing for the past month and a half-since mid-January. Patient denied being sexually active. GC Chlamydia probe, HIV, HSV, RPR pending. Rash possible contact dermatitis. Patient stable, afebrile. Patient not septic appearing. Negative signs of respiratory distress. Pulse ox 100% on room air. Negative signs of hypoxia. Discharged patient. Discharged patient with albuterol inhaler, Z-Pak, prednisone. [facility] and [facility]. Discussed with patient to rest and stay hydrated. Discussed with patient to avoid any physical strenuous activity. Discussed with patient to closely monitor symptoms and if symptoms are to worsen or change to report back to the ED - strict return instructions given.  Patient agreed to plan of care, understood, all questions answered.   Jamse Mead, PA-C 02/15/15 1847  Johnna Acosta, MD 02/16/15 [phone]

## 2015-02-15 NOTE — Discharge Instructions (Signed)
Please call your doctor for a followup appointment within 24-48 hours. When you talk to your doctor please let them know that you were seen in the emergency department and have them acquire all of your records so that they can discuss the findings with you and formulate a treatment plan to fully care for your new and ongoing problems. Please call and set-up an appointment with your primary care provider Please rest and stay hydrated Please take medications as prescribed - please take on a full stomach. Please do not drink alcohol with medications Please use albuterol as needed for the wheezing Please continue to monitor symptoms closely and if symptoms are to worsen or change (fever greater than 101, chills, sweating, nausea, vomiting, chest pain, shortness of breathe, difficulty breathing, weakness, numbness, tingling, worsening or changes to pain pattern, coughing up blood, blurred vision, dizziness, fainting) please report back to the Emergency Department immediately.   Acute Bronchitis Bronchitis is inflammation of the airways that extend from the windpipe into the lungs (bronchi). The inflammation often causes mucus to develop. This leads to a cough, which is the most common symptom of bronchitis.  In acute bronchitis, the condition usually develops suddenly and goes away over time, usually in a couple weeks. Smoking, allergies, and asthma can make bronchitis worse. Repeated episodes of bronchitis may cause further lung problems.  CAUSES Acute bronchitis is most often caused by the same virus that causes a cold. The virus can spread from person to person (contagious) through coughing, sneezing, and touching contaminated objects. SIGNS AND SYMPTOMS   Cough.   Fever.   Coughing up mucus.   Body aches.   Chest congestion.   Chills.   Shortness of breath.   Sore throat.  DIAGNOSIS  Acute bronchitis is usually diagnosed through a physical exam. Your health care provider will  also ask you questions about your medical history. Tests, such as chest X-rays, are sometimes done to rule out other conditions.  TREATMENT  Acute bronchitis usually goes away in a couple weeks. Oftentimes, no medical treatment is necessary. Medicines are sometimes given for relief of fever or cough. Antibiotic medicines are usually not needed but may be prescribed in certain situations. In some cases, an inhaler may be recommended to help reduce shortness of breath and control the cough. A cool mist vaporizer may also be used to help thin bronchial secretions and make it easier to clear the chest.  HOME CARE INSTRUCTIONS  Get plenty of rest.   Drink enough fluids to keep your urine clear or pale yellow (unless you have a medical condition that requires fluid restriction). Increasing fluids may help thin your respiratory secretions (sputum) and reduce chest congestion, and it will prevent dehydration.   Take medicines only as directed by your health care provider.  If you were prescribed an antibiotic medicine, finish it all even if you start to feel better.  Avoid smoking and secondhand smoke. Exposure to cigarette smoke or irritating chemicals will make bronchitis worse. If you are a smoker, consider using nicotine gum or skin patches to help control withdrawal symptoms. Quitting smoking will help your lungs heal faster.   Reduce the chances of another bout of acute bronchitis by washing your hands frequently, avoiding people with cold symptoms, and trying not to touch your hands to your mouth, nose, or eyes.   Keep all follow-up visits as directed by your health care provider.  SEEK MEDICAL CARE IF: Your symptoms do not improve after 1 week of treatment.  SEEK IMMEDIATE MEDICAL CARE IF:  You develop an increased fever or chills.   You have chest pain.   You have severe shortness of breath.  You have bloody sputum.   You develop dehydration.  You faint or repeatedly feel  like you are going to pass out.  You develop repeated vomiting.  You develop a severe headache. MAKE SURE YOU:   Understand these instructions.  Will watch your condition.  Will get help right away if you are not doing well or get worse. Document Released: 01/15/2005 Document Revised: 04/24/2014 Document Reviewed: 05/31/2013 [facility] Patient Information 2015 [place], [place]. This information is not intended to replace advice given to you by your health care provider. Make sure you discuss any questions you have with your health care provider.  Contact Dermatitis Contact dermatitis is a reaction to certain substances that touch the skin. Contact dermatitis can be either irritant contact dermatitis or allergic contact dermatitis. Irritant contact dermatitis does not require previous exposure to the substance for a reaction to occur.Allergic contact dermatitis only occurs if you have been exposed to the substance before. Upon a repeat exposure, your body reacts to the substance.  CAUSES  Many substances can cause contact dermatitis. Irritant dermatitis is most commonly caused by repeated exposure to mildly irritating substances, such as:  Makeup.  Soaps.  Detergents.  Bleaches.  Acids.  Metal salts, such as nickel. Allergic contact dermatitis is most commonly caused by exposure to:  Poisonous plants.  Chemicals (deodorants, shampoos).  Jewelry.  Latex.  Neomycin in triple antibiotic cream.  Preservatives in products, including clothing. SYMPTOMS  The area of skin that is exposed may develop:  Dryness or flaking.  Redness.  Cracks.  Itching.  Pain or a burning sensation.  Blisters. With allergic contact dermatitis, there may also be swelling in areas such as the eyelids, mouth, or genitals.  DIAGNOSIS  Your caregiver can usually tell what the problem is by doing a physical exam. In cases where the cause is uncertain and an allergic contact dermatitis is  suspected, a patch skin test may be performed to help determine the cause of your dermatitis. TREATMENT Treatment includes protecting the skin from further contact with the irritating substance by avoiding that substance if possible. Barrier creams, powders, and gloves may be helpful. Your caregiver may also recommend:  Steroid creams or ointments applied 2 times daily. For best results, soak the rash area in cool water for 20 minutes. Then apply the medicine. Cover the area with a plastic wrap. You can store the steroid cream in the refrigerator for a "chilly" effect on your rash. That may decrease itching. Oral steroid medicines may be needed in more severe cases.  Antibiotics or antibacterial ointments if a skin infection is present.  Antihistamine lotion or an antihistamine taken by mouth to ease itching.  Lubricants to keep moisture in your skin.  Burow's solution to reduce redness and soreness or to dry a weeping rash. Mix one packet or tablet of solution in 2 cups cool water. Dip a clean washcloth in the mixture, wring it out a bit, and put it on the affected area. Leave the cloth in place for 30 minutes. Do this as often as possible throughout the day.  Taking several cornstarch or baking soda baths daily if the area is too large to cover with a washcloth. Harsh chemicals, such as alkalis or acids, can cause skin damage that is like a burn. You should flush your skin for 15 to 20  minutes with cold water after such an exposure. You should also seek immediate medical care after exposure. Bandages (dressings), antibiotics, and pain medicine may be needed for severely irritated skin.  HOME CARE INSTRUCTIONS  Avoid the substance that caused your reaction.  Keep the area of skin that is affected away from hot water, soap, sunlight, chemicals, acidic substances, or anything else that would irritate your skin.  Do not scratch the rash. Scratching may cause the rash to become infected.  You  may take cool baths to help stop the itching.  Only take over-the-counter or prescription medicines as directed by your caregiver.  See your caregiver for follow-up care as directed to make sure your skin is healing properly. SEEK MEDICAL CARE IF:   Your condition is not better after 3 days of treatment.  You seem to be getting worse.  You see signs of infection such as swelling, tenderness, redness, soreness, or warmth in the affected area.  You have any problems related to your medicines. Document Released: 12/05/2000 Document Revised: 03/01/2012 Document Reviewed: 05/13/2011 [facility] Patient Information 2015 [place], [place]. This information is not intended to replace advice given to you by your health care provider. Make sure you discuss any questions you have with your health care provider.   Emergency Department Resource Guide 1) Find a Doctor and Pay Out of Pocket Although you won't have to find out who is covered by your insurance plan, it is a good idea to ask around and get recommendations. You will then need to call the office and see if the doctor you have chosen will accept you as a new patient and what types of options they offer for patients who are self-pay. Some doctors offer discounts or will set up payment plans for their patients who do not have insurance, but you will need to ask so you aren't surprised when you get to your appointment.  2) Contact Your Local Health Department Not all health departments have doctors that can see patients for sick visits, but many do, so it is worth a call to see if yours does. If you don't know where your local health department is, you can check in your phone book. The CDC also has a tool to help you locate your state's health department, and many state websites also have listings of all of their local health departments.  3) Find a [facility] If your illness is not likely to be very severe or complicated, you may want to try a  walk in clinic. These are popping up all over the country in pharmacies, drugstores, and shopping centers. They're usually staffed by nurse practitioners or physician assistants that have been trained to treat common illnesses and complaints. They're usually fairly quick and inexpensive. However, if you have serious medical issues or chronic medical problems, these are probably not your best option.  No Primary Care Doctor: - Call Health Connect at  [phone] - they can help you locate a primary care doctor that  accepts your insurance, provides certain services, etc. - Physician Referral Service- [phone]  Chronic Pain Problems: Organization         Address  Phone   Notes  [facility]  [phone] Patients need to be referred by their primary care doctor.   Medication Assistance: Organization         Address  Phone   Notes  [facility] Medication [facility] [address] [place], [place] [address] [phone] --  Must be a resident of [facility] -- Must have NO insurance coverage whatsoever (no Medicaid/ Medicare, etc.) -- The pt. MUST have a primary care doctor that directs their care regularly and follows them in the community   MedAssist  [phone]   [employer]  [phone]    Agencies that provide inexpensive medical care: Organization         Address  Phone   Notes  [place]  [phone]   [facility]    [phone]   [facility] [place], [place] [address] [phone]   [place] [address] [place] [phone]   Planned Parenthood    [phone]   [facility]    [phone]   [place] and [address], [place] Phone:  [phone], Fax:  [phone] Hours of Operation:  9 am - 6 pm, M-F.  Also accepts Medicaid/Medicare and self-pay.  [facility] for [employer] [place], [address], [place] Phone: [phone], Fax: [phone]. Hours of Operation:  8:30 am - 5:30 pm, M-F.  Also accepts Medicaid and self-pay.  [facility] [address] [place] Phone: [phone]   [place], [place], [place] [phone], Ext. [phone] Mondays & Thursdays: 7-9 AM.  First 15 patients are seen on a first come, first serve basis.    [place] Providers:  Organization         Address  Phone   Notes  [facility] [address], [place] [phone] Also accepts self-pay patients.  [facility] [address], [place]  [phone]   [place], [address], [place] [phone]   [facility] [address] [place] [phone]   Lucianne Lei [address], [place]   [phone] Only accepts [place] Access [place] patients after they have their name applied to their card.   Self-Pay (no insurance) in [facility]:  Organization         Address  Phone   Notes  Sickle Cell Patients, [facility] [place] [phone]   [facility] Urgent Care [place] [phone]   [facility] [place]  [place], [place], [place] [phone]   [facility]/Dr. Osei-Bonsu  [address] [place] or [address], [place] [phone] Phone number for both [place] and [place] locations is the same.  Urgent Medical and [facility] [address], [place] [phone]   [facility] [address], [place] or [address] [phone]   [facility] [address] [place] [phone], phone; [phone], fax Sees  patients 1st and 3rd Saturday of every month.  Must not qualify for public or private insurance (i.e. Medicaid, Medicare, [place] Health Choice, Veterans' Benefits)  Household income should be no more than 200% of the poverty level The clinic cannot treat you if you are pregnant or think you are pregnant  Sexually transmitted diseases are not treated at the clinic.    Dental Care: Organization  Address  Phone  Notes  [facility] [place], [place] [phone] Accepts children up to age 42 who are enrolled in [place] or [place]; pregnant women with a Medicaid card; and children who have applied for Medicaid or [place] Health Choice, but were declined, whose parents can pay a reduced fee at time of service.  [facility] Department of [facility]  [address], [place] [phone] Accepts children up to age 50 who are enrolled in [place] or [place]; pregnant women with a Medicaid card; and children who have applied for Medicaid or [place] Health Choice, but were declined, whose parents can pay a reduced fee at time of service.  [facility] Adult Dental Access PROGRAM  [place] [phone] Patients are seen by appointment only. Walk-ins are not accepted. [place] will see patients 31 years of age and older. Monday - Tuesday (8am-5pm) Most Wednesdays (8:30-5pm) $30 per visit, cash only  [facility] Adult Dental Access PROGRAM  [address], [facility] [phone] Patients are seen by appointment only. Walk-ins are not accepted. [place] will see patients 24 years of age and older. One Wednesday Evening (Monthly: Volunteer Based).  $30 per visit, cash only  [place]  [phone] for adults; Children under age 35, call Graduate Pediatric Dentistry at [phone]. Children aged 54-14, please call [phone] to request a  pediatric application.  Dental services are provided in all areas of dental care including fillings, crowns and bridges, complete and partial dentures, implants, gum treatment, root canals, and extractions. Preventive care is also provided. Treatment is provided to both adults and children. Patients are selected via a lottery and there is often a waiting list.   [facility] [address], [place]  [phone] [email or web address]   [facility] [address] [place], [place] [phone], Ext. [phone] Second and Fourth Thursday of each month, opens at 6:30 AM; Clinic ends at 9 AM.  Patients are seen on a first-come first-served basis, and a limited number are seen during each clinic.   [facility]  [address] Hillard Danker [place], [place] [phone]   Eligibility Requirements You must have lived in [place], [place], or [place] counties for at least the last three months.   You cannot be eligible for state or federal sponsored [employer], including [employer], [place], or [employer].   You generally cannot be eligible for healthcare insurance through your employer.    How to apply: Eligibility screenings are held every Tuesday and Wednesday afternoon from 1:00 pm until 4:00 pm. You do not need an appointment for the interview!  [facility] [address], [place], [place]   [place]  [place] Department  [place]  [phone]    Behavioral Health Resources in the Community: Intensive Outpatient Programs Organization         Address  Phone  Notes  [place] Wilhoit. [address] [place], [place] [phone]   [facility]patient [address], [place], [place]   ADS: Alcohol & Drug Svcs [address], [place], [place]   [place] [address]  [place], [place] or [phone]   Substance Abuse Resources  Organization         Address  Phone  Notes  Alcohol and Drug Services  [place]  [phone]   [facility] [place]  [phone]   Chinita Pester  [phone]   Residential & Outpatient Substance Abuse Program  [phone]   Psychological Services Organization         Address  Phone  Notes  [facility] [place]  [place]  [phone]   [facility] [address], [place] or [phone]    Mobile Crisis Teams Organization         Address  Phone  Notes  [facility], Mobile Crisis Care Unit  [phone]   [facility]  [address] [place], [place]   [place] [address], [place] [phone]    Self-Help/Support Groups Organization         Address  Phone             Notes  [place]. of [place] - variety of support groups  [place] Call for more information  Narcotics Anonymous (NA), [facility]aring [facility] [address], [employer] [place]  2 meetings at this location   Special educational needs teacher         Address  Phone  Notes  ASAP Residential Treatment [place],    [place]  [phone]   [facility]  [address] [place] [phone], [place], [place]   [place], [place] [phone] Admissions: 8am-3pm M-F  Incentives Substance [place] [address],    [place], [place] [phone]   [facility] [address] [place], [place], [place]   [facility] [address]  [place], [place]   [facility] Programs - Intensive Outpatient [address] Kristeen Mans [address], [place], [place]   [facility] [place].,    [place], [place] [phone] or [phone]   [facility] Services (RTS) [address], [place], [place] Accepts Medicaid  [facility] [place] [address],  [place] [phone] Substance Abuse/Addiction Treatment   [facility] Organization         Address  Phone  Notes  [facility]  [phone]   Domenic Schwab, PhD [address] Arlis Porta [place], [place]   [phone] or [phone]   [facility] [place], [place] [phone]   [facility] [address] [place], [place] [phone] Insurance/Medicaid/sponsorship through [facility] and Families [address], [address] [place]                                    [place], [place] [phone] [place] [address], [place] [phone]    Dr. Adele Schilder  [phone]   [facility] Dept. 1) [address],  2) [place] 3)  [place] [address], [place] [phone]  [phone]   [place] [phone] or [phone] (After Hours)

## 2015-02-16 LAB — GC/CHLAMYDIA PROBE AMP (~~LOC~~) NOT AT ARMC
CHLAMYDIA, DNA PROBE: NEGATIVE
NEISSERIA GONORRHEA: NEGATIVE

## 2015-02-16 LAB — HIV ANTIBODY (ROUTINE TESTING W REFLEX)
HIV SCREEN 4TH GENERATION: NONREACTIVE
HIV Screen 4th Generation wRfx: NONREACTIVE

## 2015-02-17 LAB — RPR: RPR Ser Ql: NONREACTIVE

## 2015-02-21 LAB — HSV 2 ANTIBODY, IGG

## 2015-02-21 LAB — HSV 1 ANTIBODY, IGG: HSV 1 Glycoprotein G Ab, IgG: <0.91 index (ref 0.00–0.90)

## 2015-02-22 ENCOUNTER — Telehealth (HOSPITAL_COMMUNITY): Payer: Self-pay

## 2015-05-06 ENCOUNTER — Emergency Department (HOSPITAL_COMMUNITY): Payer: Medicaid Other

## 2015-05-06 ENCOUNTER — Emergency Department (HOSPITAL_COMMUNITY)
Admission: EM | Admit: 2015-05-06 | Discharge: 2015-05-06 | Disposition: A | Discharge status: Home / Self Care | Payer: Medicaid Other | Attending: Emergency Medicine | Admitting: Emergency Medicine

## 2015-05-06 ENCOUNTER — Encounter (HOSPITAL_COMMUNITY): Payer: Self-pay | Admitting: Nurse Practitioner

## 2015-05-06 DIAGNOSIS — N4889 Other specified disorders of penis: Secondary | ICD-10-CM | POA: Insufficient documentation

## 2015-05-06 DIAGNOSIS — Z202 Contact with and (suspected) exposure to infections with a predominantly sexual mode of transmission: Secondary | ICD-10-CM | POA: Diagnosis not present

## 2015-05-06 DIAGNOSIS — Z79899 Other long term (current) drug therapy: Secondary | ICD-10-CM | POA: Diagnosis not present

## 2015-05-06 DIAGNOSIS — J45901 Unspecified asthma with (acute) exacerbation: Secondary | ICD-10-CM | POA: Insufficient documentation

## 2015-05-06 DIAGNOSIS — Z880 Allergy status to penicillin: Secondary | ICD-10-CM | POA: Diagnosis not present

## 2015-05-06 DIAGNOSIS — Z792 Long term (current) use of antibiotics: Secondary | ICD-10-CM | POA: Insufficient documentation

## 2015-05-06 DIAGNOSIS — R05 Cough: Secondary | ICD-10-CM | POA: Insufficient documentation

## 2015-05-06 DIAGNOSIS — R059 Cough, unspecified: Secondary | ICD-10-CM

## 2015-05-06 DIAGNOSIS — R0602 Shortness of breath: Secondary | ICD-10-CM | POA: Diagnosis present

## 2015-05-06 DIAGNOSIS — R300 Dysuria: Secondary | ICD-10-CM | POA: Diagnosis not present

## 2015-05-06 LAB — URINALYSIS, ROUTINE W REFLEX MICROSCOPIC
Bilirubin Urine: NEGATIVE
Glucose, UA: NEGATIVE mg/dL
Hgb urine dipstick: NEGATIVE
Ketones, ur: NEGATIVE mg/dL
Leukocytes, UA: NEGATIVE
Nitrite: NEGATIVE
Protein, ur: NEGATIVE mg/dL
Specific Gravity, Urine: 1.024 (ref 1.005–1.030)
Urobilinogen, UA: 1 mg/dL (ref 0.0–1.0)
pH: 6.5 (ref 5.0–8.0)

## 2015-05-06 LAB — WET PREP, GENITAL
Clue Cells Wet Prep HPF POC: NONE SEEN
Trich, Wet Prep: NONE SEEN
WBC, Wet Prep HPF POC: NONE SEEN
Yeast Wet Prep HPF POC: NONE SEEN

## 2015-05-06 MED ORDER — STERILE WATER FOR INJECTION IJ SOLN: 0.9000 mL | Freq: Once | INTRAMUSCULAR | Status: DC

## 2015-05-06 MED ORDER — CEFTRIAXONE SODIUM 250 MG IJ SOLR
250.0000 mg | Freq: Once | INTRAMUSCULAR | Status: AC
  Administered 2015-05-06: 250 mg via INTRAMUSCULAR
  Filled 2015-05-06: qty 250

## 2015-05-06 MED ORDER — METRONIDAZOLE 500 MG PO TABS
2000.0000 mg | ORAL_TABLET | Freq: Once | ORAL | Status: AC
  Administered 2015-05-06: 2000 mg via ORAL
  Filled 2015-05-06: qty 4

## 2015-05-06 MED ORDER — AZITHROMYCIN 250 MG PO TABS
1000.0000 mg | ORAL_TABLET | Freq: Once | ORAL | Status: AC
  Administered 2015-05-06: 1000 mg via ORAL
  Filled 2015-05-06: qty 4

## 2015-05-06 MED ORDER — STERILE WATER FOR INJECTION IJ SOLN
INTRAMUSCULAR | Status: AC
  Filled 2015-05-06: qty 10

## 2015-05-06 NOTE — ED Notes (Signed)
Declined W/C at D/C and was escorted to lobby by RN. 

## 2015-05-06 NOTE — ED Notes (Signed)
Pt states he has had a cough for past month which he was seen here for and diagnosed with bronchitis. He took the medication he was prescribed but his cough continues. He also would like to be checked for STDs. He is alert and breathing easily

## 2015-05-06 NOTE — ED Provider Notes (Signed)
CSN: [id number]     Arrival date & time 05/06/15  1257 History   First MD Initiated Contact with Patient 05/06/15 1325     Chief Complaint  Patient presents with  . Shortness of Breath  . SEXUALLY TRANSMITTED DISEASE     (Consider location/radiation/quality/duration/timing/severity/associated sxs/prior Treatment) HPI Pt is a 24yo male presenting to ED with concern for STDs as well as bronchitis. Pt states he is concerned for bronchitis as he was dx 1 month ago and tx with azithromycin but states he still has occasional SOB and cough.  No hx of asthma. Denies hx of blood clots. Denies leg pain or swelling. Denies fever, n/v/d. Denies sick contacts or recent travel.  Pt also concerned for STDs as he reports has had a rash on the tip of his penis for several months. He was tested for herpes but does not recall where and does not recall getting results back.  Reports mild pain, states he thinks it is balanitis.  Reports mild burning with urination. Pt states he has been in a monogamous relationship for 3 years but his girlfriend was just dx with trichomonas.  Pt reports having unprotected intercourse.  Past Medical History  Diagnosis Date  . Asthma     childhood   Past Surgical History  Procedure Laterality Date  . Circumcision N/A 03/23/2013    Procedure: Circumcision  ;  Surgeon: Alexis Frock, MD;  Location: [facility];  Service: Urology;  Laterality: N/A;  with block   History reviewed. No pertinent family history. History  Substance Use Topics  . Smoking status: Never Smoker   . Smokeless tobacco: Not on file  . Alcohol Use: No    Review of Systems  Constitutional: Negative for fever, chills and appetite change.  HENT: Negative for congestion.   Respiratory: Positive for cough and shortness of breath. Negative for wheezing and stridor.   Cardiovascular: Negative for chest pain, palpitations and leg swelling.  Gastrointestinal: Negative for nausea, vomiting,  abdominal pain and diarrhea.  Genitourinary: Positive for dysuria and penile pain ( burn with ejaculation). Negative for urgency, frequency, flank pain, decreased urine volume, discharge, penile swelling, scrotal swelling and testicular pain.  All other systems reviewed and are negative.     Allergies  Penicillins and Zyrtec  Home Medications   Prior to Admission medications   Medication Sig Start Date End Date Taking? Authorizing Provider  azithromycin (ZITHROMAX) 250 MG tablet Take 1 tablet (250 mg total) by mouth daily. Take first 2 tablets together, then 1 every day until finished. 02/15/15   Marissa Sciacca, PA-C  DM-Doxylamine-Acetaminophen (NYQUIL COLD & FLU PO) Take 1 tablet by mouth daily as needed (cold symptoms).    Historical Provider, MD  predniSONE (DELTASONE) 20 MG tablet 3 tabs po day one, then 2 tabs daily x 4 days 02/15/15   Marissa Sciacca, PA-C   BP 121/72 mmHg  Pulse 60  Temp(Src) 97.3 F (36.3 C) (Oral)  Resp 16  SpO2 100% Physical Exam  Constitutional: He appears well-developed and well-nourished.  HENT:  Head: Normocephalic and atraumatic.  Eyes: Conjunctivae are normal. No scleral icterus.  Neck: Normal range of motion.  Cardiovascular: Normal rate, regular rhythm and normal heart sounds.   Pulmonary/Chest: Effort normal and breath sounds normal. No respiratory distress. He has no wheezes. He has no rales. He exhibits no tenderness.  Abdominal: Soft. Bowel sounds are normal. He exhibits no distension and no mass. There is no tenderness. There is no rebound and no  guarding.  Genitourinary:  Chaperoned exam.  Small white papules on head of penis, not tender to touch. No bleeding or discharge. No testicular swelling or tenderness.  Musculoskeletal: Normal range of motion.  Neurological: He is alert.  Skin: Skin is warm and dry.  Nursing note and vitals reviewed.   ED Course  Procedures (including critical care time) Labs Review Labs Reviewed  WET PREP,  GENITAL  URINALYSIS, ROUTINE W REFLEX MICROSCOPIC  HIV ANTIBODY (ROUTINE TESTING)  RPR  GC/CHLAMYDIA PROBE AMP ([place])    Imaging Review Dg Chest 2 View  05/06/2015   CLINICAL DATA:  Intermittent shortness of breath for 1 month.  EXAM: CHEST  2 VIEW  COMPARISON:  February 15, 2015  FINDINGS: The heart size and mediastinal contours are within normal limits. There is no focal infiltrate, pulmonary edema, or pleural effusion. The visualized skeletal structures are unremarkable.  IMPRESSION: No active cardiopulmonary disease.   Electronically Signed   By: Abelardo Diesel M.D.   On: 05/06/2015 14:02     EKG Interpretation None      MDM   Final diagnoses:  Cough  Trichomonas exposure  Dysuria    Pt concerned for trichomonas exposure as girlfriend was recently dx.  Will test but also tx empirically with flagyl, azithromycin, and rocephin.  Labs: unremarkable.    Pt also concerned for bronchitis. Pt is PERC negative. Lungs: CTAB. CXR: no active cardiopulmonary disease. Will have pt f/u with his PCP for recheck of symptoms including further testing and treatment of STDs. Return precautions provided. Pt verbalized understanding and agreement with tx plan.     Noland Fordyce, PA-C 05/06/15 [place], MD 05/08/15 [phone]

## 2015-05-07 LAB — GC/CHLAMYDIA PROBE AMP (~~LOC~~) NOT AT ARMC
Chlamydia: NEGATIVE
Neisseria Gonorrhea: NEGATIVE

## 2015-05-07 LAB — HIV ANTIBODY (ROUTINE TESTING W REFLEX): HIV Screen 4th Generation wRfx: NONREACTIVE

## 2015-05-07 LAB — RPR: RPR Ser Ql: NONREACTIVE

## 2016-02-29 ENCOUNTER — Encounter (HOSPITAL_COMMUNITY): Payer: Self-pay | Admitting: Emergency Medicine

## 2016-02-29 ENCOUNTER — Emergency Department (HOSPITAL_COMMUNITY)
Admission: EM | Admit: 2016-02-29 | Discharge: 2016-02-29 | Disposition: A | Discharge status: Home / Self Care | Payer: Medicaid Other | Attending: Emergency Medicine | Admitting: Emergency Medicine

## 2016-02-29 ENCOUNTER — Emergency Department (HOSPITAL_COMMUNITY): Payer: Medicaid Other

## 2016-02-29 DIAGNOSIS — R058 Other specified cough: Secondary | ICD-10-CM

## 2016-02-29 DIAGNOSIS — R0789 Other chest pain: Secondary | ICD-10-CM | POA: Diagnosis not present

## 2016-02-29 DIAGNOSIS — J4530 Mild persistent asthma, uncomplicated: Secondary | ICD-10-CM

## 2016-02-29 DIAGNOSIS — R05 Cough: Secondary | ICD-10-CM

## 2016-02-29 DIAGNOSIS — J4531 Mild persistent asthma with (acute) exacerbation: Secondary | ICD-10-CM | POA: Diagnosis not present

## 2016-02-29 DIAGNOSIS — Z880 Allergy status to penicillin: Secondary | ICD-10-CM | POA: Insufficient documentation

## 2016-02-29 DIAGNOSIS — R079 Chest pain, unspecified: Secondary | ICD-10-CM | POA: Diagnosis present

## 2016-02-29 DIAGNOSIS — Z8709 Personal history of other diseases of the respiratory system: Secondary | ICD-10-CM

## 2016-02-29 LAB — BASIC METABOLIC PANEL
Anion gap: 11 (ref 5–15)
BUN: 9 mg/dL (ref 6–20)
CALCIUM: 9.1 mg/dL (ref 8.9–10.3)
CO2: 26 mmol/L (ref 22–32)
CREATININE: 1.09 mg/dL (ref 0.61–1.24)
Chloride: 105 mmol/L (ref 101–111)
GFR calc Af Amer: >60 mL/min (ref >60)
Glucose, Bld: 101 mg/dL — ABNORMAL HIGH (ref 65–99)
Potassium: 3.7 mmol/L (ref 3.5–5.1)
SODIUM: 142 mmol/L (ref 135–145)

## 2016-02-29 LAB — CBC
HCT: 45.1 % (ref 39.0–52.0)
Hemoglobin: 14.6 g/dL (ref 13.0–17.0)
MCH: 30 pg (ref 26.0–34.0)
MCHC: 32.4 g/dL (ref 30.0–36.0)
MCV: 92.8 fL (ref 78.0–100.0)
PLATELETS: 276 10*3/uL (ref 150–400)
RBC: 4.86 MIL/uL (ref 4.22–5.81)
RDW: 13.4 % (ref 11.5–15.5)
WBC: 5.7 10*3/uL (ref 4.0–10.5)

## 2016-02-29 LAB — I-STAT TROPONIN, ED: Troponin i, poc: 0 ng/mL (ref 0.00–0.08)

## 2016-02-29 MED ORDER — IPRATROPIUM BROMIDE 0.02 % IN SOLN
0.5000 mg | Freq: Once | RESPIRATORY_TRACT | Status: AC
  Administered 2016-02-29: 0.5 mg via RESPIRATORY_TRACT
  Filled 2016-02-29: qty 2.5

## 2016-02-29 MED ORDER — ALBUTEROL SULFATE (2.5 MG/3ML) 0.083% IN NEBU
5.0000 mg | INHALATION_SOLUTION | Freq: Once | RESPIRATORY_TRACT | Status: AC
  Administered 2016-02-29: 5 mg via RESPIRATORY_TRACT
  Filled 2016-02-29: qty 6

## 2016-02-29 MED ORDER — ALBUTEROL SULFATE HFA 108 (90 BASE) MCG/ACT IN AERS: 2.0000 | INHALATION_SPRAY | RESPIRATORY_TRACT | Status: DC | PRN

## 2016-02-29 NOTE — ED Provider Notes (Addendum)
CSN: [id number]     Arrival date & time 02/29/16  [id number] History   First MD Initiated Contact with Patient 02/29/16 [phone]     Chief Complaint  Patient presents with  . Chest Pain     (Consider location/radiation/quality/duration/timing/severity/associated sxs/prior Treatment) Patient is a 25 y.o. male presenting with chest pain. The history is provided by the patient.  Chest Pain Associated symptoms: cough   Associated symptoms: no abdominal pain, no back pain, no fever, no headache and not vomiting   Patient c/o mid chest pain onset yesterday. States was tightness in chest that started after putting on a shirt that was very tight.  States after taking off shirt, tightness persists. Symptoms at rest. Constant. No change w activity or exertion. Felt mildly sob. Briefly felt palpitations yesterday. No syncope or near syncope.  No other recent exertional cp or discomfort, even w exertion. No hx syncope. Denies leg pain or swelling. No hx surgery, immobility, trauma or travel. Pt notes occasional non prod cough in past couple days. No runny nose or sore throat. No fever or chills.   Hx childhood asthma. No recent mdi use.        Past Medical History  Diagnosis Date  . Asthma     childhood   Past Surgical History  Procedure Laterality Date  . Circumcision N/A 03/23/2013    Procedure: Circumcision  ;  Surgeon: Alexis Frock, MD;  Location: [facility];  Service: Urology;  Laterality: N/A;  with block   No family history on file. Social History  Substance Use Topics  . Smoking status: Never Smoker   . Smokeless tobacco: None  . Alcohol Use: No    Review of Systems  Constitutional: Negative for fever and chills.  HENT: Negative for sore throat.   Eyes: Negative for discharge and redness.  Respiratory: Positive for cough.   Cardiovascular: Positive for chest pain.  Gastrointestinal: Negative for vomiting, abdominal pain and diarrhea.  Genitourinary: Negative for flank  pain.  Musculoskeletal: Negative for back pain and neck pain.  Skin: Negative for rash.  Neurological: Negative for headaches.  Hematological: Does not bruise/bleed easily.  Psychiatric/Behavioral: Negative for confusion.      Allergies  Penicillins and Zyrtec  Home Medications   Prior to Admission medications   Medication Sig Start Date End Date Taking? Authorizing Provider  azithromycin (ZITHROMAX) 250 MG tablet Take 1 tablet (250 mg total) by mouth daily. Take first 2 tablets together, then 1 every day until finished. 02/15/15   Marissa Sciacca, PA-C  DM-Doxylamine-Acetaminophen (NYQUIL COLD & FLU PO) Take 1 tablet by mouth daily as needed (cold symptoms).    Historical Provider, MD  predniSONE (DELTASONE) 20 MG tablet 3 tabs po day one, then 2 tabs daily x 4 days 02/15/15   Marissa Sciacca, PA-C   BP 121/76 mmHg  Pulse 60  Temp(Src) 98.1 F (36.7 C) (Oral)  Resp 14  Ht 5\' 11"  (1.803 m)  Wt 88.905 kg  BMI 27.35 kg/m2  SpO2 100% Physical Exam  Constitutional: He is oriented to person, place, and time. He appears well-developed and well-nourished. No distress.  HENT:  Mouth/Throat: Oropharynx is clear and moist.  Eyes: Conjunctivae are normal. No scleral icterus.  Neck: Neck supple. No JVD present. No tracheal deviation present.  Cardiovascular: Normal rate, regular rhythm, normal heart sounds and intact distal pulses.  Exam reveals no gallop and no friction rub.   No murmur heard. Pulmonary/Chest: Effort normal. No accessory muscle usage. No respiratory  distress. He has wheezes. He exhibits no tenderness.  Slight wheezing. Non prod cough. Good air exchange.   Abdominal: Soft. He exhibits no distension. There is no tenderness.  Musculoskeletal: Normal range of motion. He exhibits no edema or tenderness.  Lymphadenopathy:    He has no cervical adenopathy.  Neurological: He is alert and oriented to person, place, and time.  Skin: Skin is warm and dry. He is not diaphoretic.   Psychiatric: He has a normal mood and affect.  Nursing note and vitals reviewed.   ED Course  Procedures (including critical care time) Labs Review   Results for orders placed or performed during the hospital encounter of [id number]  Basic metabolic panel  Result Value Ref Range   Sodium 142 135 - 145 mmol/L   Potassium 3.7 3.5 - 5.1 mmol/L   Chloride 105 101 - 111 mmol/L   CO2 26 22 - 32 mmol/L   Glucose, Bld 101 (H) 65 - 99 mg/dL   BUN 9 6 - 20 mg/dL   Creatinine, Ser 1.09 0.61 - 1.24 mg/dL   Calcium 9.1 8.9 - 10.3 mg/dL   GFR calc non Af Amer >60 >60 mL/min   GFR calc Af Amer >60 >60 mL/min   Anion gap 11 5 - 15  CBC  Result Value Ref Range   WBC 5.7 4.0 - 10.5 K/uL   RBC 4.86 4.22 - 5.81 MIL/uL   Hemoglobin 14.6 13.0 - 17.0 g/dL   HCT 45.1 39.0 - 52.0 %   MCV 92.8 78.0 - 100.0 fL   MCH 30.0 26.0 - 34.0 pg   MCHC 32.4 30.0 - 36.0 g/dL   RDW 13.4 11.5 - 15.5 %   Platelets 276 150 - 400 K/uL  I-stat troponin, ED (not at [facility], [facility])  Result Value Ref Range   Troponin i, poc 0.00 0.00 - 0.08 ng/mL   Comment 3           Dg Chest 2 View  02/29/2016  CLINICAL DATA:  Chest pain.  Tachycardia. EXAM: CHEST - 2 VIEW COMPARISON:  Two-view chest x-ray 05/06/2015. FINDINGS: The heart size and mediastinal contours are within normal limits. Both lungs are clear. The visualized skeletal structures are unremarkable. IMPRESSION: Negative two view chest x-ray Electronically Signed   By: San Morelle M.D.   On: 02/29/2016 08:28       I have personally reviewed and evaluated these images and lab results as part of my medical decision-making.   EKG Interpretation   Date/Time:  Friday February 29 2016 07:59:41 EST Ventricular Rate:  69 PR Interval:  148 QRS Duration: 84 QT Interval:  380 QTC Calculation: 407 R Axis:   87 Text Interpretation:  Normal sinus rhythm Normal ECG No significant change  since last tracing Confirmed by Ashok Cordia  MD, Lennette Bihari ([id number]) on 02/29/2016  9:35:57  AM      MDM   Labs. Cxr.  Reviewed nursing notes and prior charts for additional history.   Albuterol and atrovent neb.  Recheck no increased wob.  Pt currently appears stable for d/c.        Lajean Saver, MD 02/29/16 1009

## 2016-02-29 NOTE — ED Notes (Signed)
Pt c/o chest tightness onset Wednesday after putting on a tight shirt. Pt took the shirt off but the chest tightness remained. Pt reports woke up out his sleep with the chest tightness and feeling like his heart was racing. Pt reports shortness of breath.

## 2016-02-29 NOTE — ED Notes (Signed)
Patient undressed, in gown, on monitor, continuous pulse oximetry and blood pressure cuff; Gabe, RN at bedside

## 2016-02-29 NOTE — ED Notes (Signed)
Patient d/c'd from monitor, continuous pulse oximetry and blood pressure cuff; patient getting dressed to be discharged home 

## 2016-02-29 NOTE — Discharge Instructions (Signed)
It was our pleasure to provide your ER care today - we hope that you feel better.  Use albuterol inhaler as need if wheezing.  Try mucinex or robitussin as need for cough.  Take tylenol and/or advil as need.   Follow up with primary care doctor in 1 week if symptoms fail to improve/resolve.  Return to ER if worse, new symptoms, fevers, trouble breathing, other concern.    Cough, Adult Coughing is a reflex that clears your throat and your airways. Coughing helps to heal and protect your lungs. It is normal to cough occasionally, but a cough that happens with other symptoms or lasts a long time may be a sign of a condition that needs treatment. A cough may last only 2-3 weeks (acute), or it may last longer than 8 weeks (chronic). CAUSES Coughing is commonly caused by:  Breathing in substances that irritate your lungs.  A viral or bacterial respiratory infection.  Allergies.  Asthma.  Postnasal drip.  Smoking.  Acid backing up from the stomach into the esophagus (gastroesophageal reflux).  Certain medicines.  Chronic lung problems, including COPD (or rarely, lung cancer).  Other medical conditions such as heart failure. HOME CARE INSTRUCTIONS  Pay attention to any changes in your symptoms. Take these actions to help with your discomfort:  Take medicines only as told by your health care provider.  If you were prescribed an antibiotic medicine, take it as told by your health care provider. Do not stop taking the antibiotic even if you start to feel better.  Talk with your health care provider before you take a cough suppressant medicine.  Drink enough fluid to keep your urine clear or pale yellow.  If the air is dry, use a cold steam vaporizer or humidifier in your bedroom or your home to help loosen secretions.  Avoid anything that causes you to cough at work or at home.  If your cough is worse at night, try sleeping in a semi-upright position.  Avoid cigarette  smoke. If you smoke, quit smoking. If you need help quitting, ask your health care provider.  Avoid caffeine.  Avoid alcohol.  Rest as needed. SEEK MEDICAL CARE IF:   You have new symptoms.  You cough up pus.  Your cough does not get better after 2-3 weeks, or your cough gets worse.  You cannot control your cough with suppressant medicines and you are losing sleep.  You develop pain that is getting worse or pain that is not controlled with pain medicines.  You have a fever.  You have unexplained weight loss.  You have night sweats. SEEK IMMEDIATE MEDICAL CARE IF:  You cough up blood.  You have difficulty breathing.  Your heartbeat is very fast.   This information is not intended to replace advice given to you by your health care provider. Make sure you discuss any questions you have with your health care provider.   Document Released: 06/06/2011 Document Revised: 08/29/2015 Document Reviewed: 02/14/2015 Elsevier Interactive Patient Education 2016 [place].     Asthma, Adult Asthma is a condition of the lungs in which the airways tighten and narrow. Asthma can make it hard to breathe. Asthma cannot be cured, but medicine and lifestyle changes can help control it. Asthma may be started (triggered) by:  Animal skin flakes (dander).  Dust.  Cockroaches.  Pollen.  Mold.  Smoke.  Cleaning products.  Hair sprays or aerosol sprays.  Paint fumes or strong smells.  Cold air, weather changes, and winds.  Crying or laughing hard.  Stress.  Certain medicines or drugs.  Foods, such as dried fruit, potato chips, and sparkling grape juice.  Infections or conditions (colds, flu).  Exercise.  Certain medical conditions or diseases.  Exercise or tiring activities. HOME CARE   Take medicine as told by your doctor.  Use a peak flow meter as told by your doctor. A peak flow meter is a tool that measures how well the lungs are working.  Record and  keep track of the peak flow meter's readings.  Understand and use the asthma action plan. An asthma action plan is a written plan for taking care of your asthma and treating your attacks.  To help prevent asthma attacks:  Do not smoke. Stay away from secondhand smoke.  Change your heating and air conditioning filter often.  Limit your use of fireplaces and wood stoves.  Get rid of pests (such as roaches and mice) and their droppings.  Throw away plants if you see mold on them.  Clean your floors. Dust regularly. Use cleaning products that do not smell.  Have someone vacuum when you are not home. Use a vacuum cleaner with a HEPA filter if possible.  Replace carpet with wood, tile, or vinyl flooring. Carpet can trap animal skin flakes and dust.  Use allergy-proof pillows, mattress covers, and box spring covers.  Wash bed sheets and blankets every week in hot water and dry them in a dryer.  Use blankets that are made of polyester or cotton.  Clean bathrooms and kitchens with bleach. If possible, have someone repaint the walls in these rooms with mold-resistant paint. Keep out of the rooms that are being cleaned and painted.  Wash hands often. GET HELP IF:  You have make a whistling sound when breaking (wheeze), have shortness of breath, or have a cough even if taking medicine to prevent attacks.  The colored mucus you cough up (sputum) is thicker than usual.  The colored mucus you cough up changes from clear or white to yellow, green, gray, or bloody.  You have problems from the medicine you are taking such as:  A rash.  Itching.  Swelling.  Trouble breathing.  You need reliever medicines more than 2-3 times a week.  Your peak flow measurement is still at 50-79% of your personal best after following the action plan for 1 hour.  You have a fever. GET HELP RIGHT AWAY IF:   You seem to be worse and are not responding to medicine during an asthma attack.  You are  short of breath even at rest.  You get short of breath when doing very little activity.  You have trouble eating, drinking, or talking.  You have chest pain.  You have a fast heartbeat.  Your lips or fingernails start to turn blue.  You are light-headed, dizzy, or faint.  Your peak flow is less than 50% of your personal best.   This information is not intended to replace advice given to you by your health care provider. Make sure you discuss any questions you have with your health care provider.   Document Released: 05/26/2008 Document Revised: 08/29/2015 Document Reviewed: 07/07/2013 Elsevier Interactive Patient Education [employer].

## 2016-02-29 NOTE — ED Notes (Signed)
Brought patient back to room; patient getting undressed and into a gown at this time 

## 2016-08-05 ENCOUNTER — Encounter (HOSPITAL_COMMUNITY): Payer: Self-pay | Admitting: Emergency Medicine

## 2016-08-05 ENCOUNTER — Emergency Department (HOSPITAL_COMMUNITY)
Admission: EM | Admit: 2016-08-05 | Discharge: 2016-08-05 | Disposition: A | Discharge status: Home / Self Care | Payer: Medicaid Other | Attending: Emergency Medicine | Admitting: Emergency Medicine

## 2016-08-05 DIAGNOSIS — M5440 Lumbago with sciatica, unspecified side: Secondary | ICD-10-CM | POA: Insufficient documentation

## 2016-08-05 DIAGNOSIS — J45909 Unspecified asthma, uncomplicated: Secondary | ICD-10-CM | POA: Diagnosis not present

## 2016-08-05 DIAGNOSIS — M545 Low back pain: Secondary | ICD-10-CM | POA: Diagnosis present

## 2016-08-05 MED ORDER — NAPROXEN 500 MG PO TABS: 500.0000 mg | ORAL_TABLET | Freq: Two times a day (BID) | ORAL | 0 refills | Status: DC

## 2016-08-05 MED ORDER — METHOCARBAMOL 500 MG PO TABS: 500.0000 mg | ORAL_TABLET | Freq: Two times a day (BID) | ORAL | 0 refills | Status: DC

## 2016-08-05 NOTE — ED Triage Notes (Signed)
Back pain lumbar area started "awhile ago" -- worse in past few days-- states causes "pins and needles in arms and legs" . Unrelieved with OTC meds.

## 2016-08-05 NOTE — ED Provider Notes (Signed)
[place] DEPT Provider Note   CSN: [id number] Arrival date & time: 08/05/16  1142  By signing my name below, I, Cody [name], attest that this documentation has been prepared under the direction and in the presence of Cody [name], [place]. Electronically Signed: Sonum [name], Education administrator. 08/05/16. 12:04 PM.  History   Chief Complaint Chief Complaint  Patient presents with  . Back Pain    The history is provided by the patient. No language interpreter was used.     HPI Comments: Cody [name] is a 25 y.o. male who presents to the Emergency Department complaining of several months of intermittent lower back pain that is gradually becoming more frequent. He states the pain is 8/10 at worst but is currently 0/10. He states it first started after working out and doing sit ups on a bench. He has taken ibuprofen and Aleve with some relief. He also complains of intermittent paresthesias to the BUE and BLE. He denies fever, chills, numbness, weakness, bowel/bladder incontinence, saddle paresthesias, nausea, vomiting, dysuria, hematuria.   Past Medical History:  Diagnosis Date  . Asthma    childhood    There are no active problems to display for this patient.   Past Surgical History:  Procedure Laterality Date  . CIRCUMCISION N/A 03/23/2013   Procedure: Circumcision  ;  Surgeon: Alexis Frock, MD;  Location: [facility];  Service: Urology;  Laterality: N/A;  with block       Home Medications    Prior to Admission medications   Medication Sig Start Date End Date Taking? Authorizing Provider  albuterol (PROVENTIL HFA;VENTOLIN HFA) 108 (90 Base) MCG/ACT inhaler Inhale 2 puffs into the lungs every 4 (four) hours as needed for wheezing or shortness of breath. 02/29/16   Lajean Saver, MD  azithromycin (ZITHROMAX) 250 MG tablet Take 1 tablet (250 mg total) by mouth daily. Take first 2 tablets together, then 1 every day until finished. 02/15/15   Marissa Sciacca, PA-C    DM-Doxylamine-Acetaminophen (NYQUIL COLD & FLU PO) Take 1 tablet by mouth daily as needed (cold symptoms).    Historical Provider, MD  predniSONE (DELTASONE) 20 MG tablet 3 tabs po day one, then 2 tabs daily x 4 days 02/15/15   Jamse Mead, PA-C    Family History No family history on file.  Social History Social History  Substance Use Topics  . Smoking status: Never Smoker  . Smokeless tobacco: Never Used  . Alcohol use No     Allergies   Penicillins and Zyrtec [cetirizine]   Review of Systems Review of Systems 10 Systems reviewed and all are negative for acute change except as noted in the HPI.    Physical Exam Updated Vital Signs BP 134/75   Pulse 72   Temp 98.3 F (36.8 C) (Oral)   Resp 16   Ht 5\' 11"  (1.803 m)   Wt 190 lb (86.2 kg)   SpO2 100%   BMI 26.50 kg/m   Physical Exam  Constitutional: He is oriented to person, place, and time. He appears well-developed and well-nourished.  HENT:  Head: Normocephalic and atraumatic.  Cardiovascular: Normal rate.   Pulmonary/Chest: Effort normal.  Musculoskeletal: Normal range of motion. He exhibits no edema, tenderness or deformity.  No midline back tenderness. No step offs or deformities. No paraspinal tenderness. Positive left straight leg raise. Ambulatory with steady gait. 5/5 strength in both upper and lower extremities.     Neurological: He is alert and oriented to person, place, and time. He exhibits  normal muscle tone. Coordination normal.  Skin: Skin is warm and dry.  Psychiatric: He has a normal mood and affect.  Nursing note and vitals reviewed.    ED Treatments / Results  DIAGNOSTIC STUDIES: Oxygen Saturation is 100% on RA, normal by my interpretation.    COORDINATION OF CARE: 12:05 PM Discussed treatment plan with pt at bedside and pt agreed to plan.    Labs (all labs ordered are listed, but only abnormal results are displayed) Labs Reviewed - No data to display  EKG  EKG  Interpretation None       Radiology No results found.  Procedures Procedures (including critical care time)  Medications Ordered in ED Medications - No data to display   Initial Impression / Assessment and Plan / ED Course  I have reviewed the triage vital signs and the nursing notes.  Pertinent labs & imaging results that were available during my care of the patient were reviewed by me and considered in my medical decision making (see chart for details).  Clinical Course    Patient with back pain.  No neurological deficits and normal neuro exam.  He has no midline tenderness. He does have a positive left SLR and with history of pain starting a few months ago after exercise I do suspect he might have some degree of disc herniation. However, no indication for emergent spinal imaging or further workup at this time. Patient is ambulatory.  No loss of bowel or bladder control.  No concern for cauda equina.  No fever, night sweats, weight loss, h/o cancer, IVDA, no recent procedure to back. No urinary symptoms suggestive of UTI.  Supportive care and return precaution discussed. Appears safe for discharge at this time. Follow up as indicated in discharge paperwork.    Final Clinical Impressions(s) / ED Diagnoses   Final diagnoses:  Low back pain with sciatica, sciatica laterality unspecified, unspecified back pain laterality    New Prescriptions Discharge Medication List as of 08/05/2016 12:18 PM    START taking these medications   Details  methocarbamol (ROBAXIN) 500 MG tablet Take 1 tablet (500 mg total) by mouth 2 (two) times daily., Starting Tue 08/05/2016, Print    naproxen (NAPROSYN) 500 MG tablet Take 1 tablet (500 mg total) by mouth 2 (two) times daily., Starting Tue 08/05/2016, Print        I personally performed the services described in this documentation, which was scribed in my presence. The recorded information has been reviewed and is accurate.    Cody Ng,  PA-C 08/05/16 [place], MD 08/06/16 [phone]

## 2016-09-10 ENCOUNTER — Emergency Department (HOSPITAL_COMMUNITY)
Admission: EM | Admit: 2016-09-10 | Discharge: 2016-09-10 | Disposition: A | Discharge status: Home / Self Care | Payer: Medicaid Other | Attending: Emergency Medicine | Admitting: Emergency Medicine

## 2016-09-10 ENCOUNTER — Encounter (HOSPITAL_COMMUNITY): Payer: Self-pay | Admitting: Emergency Medicine

## 2016-09-10 DIAGNOSIS — Z79899 Other long term (current) drug therapy: Secondary | ICD-10-CM | POA: Insufficient documentation

## 2016-09-10 DIAGNOSIS — J329 Chronic sinusitis, unspecified: Secondary | ICD-10-CM | POA: Diagnosis not present

## 2016-09-10 DIAGNOSIS — M7918 Myalgia, other site: Secondary | ICD-10-CM

## 2016-09-10 DIAGNOSIS — M791 Myalgia: Secondary | ICD-10-CM | POA: Insufficient documentation

## 2016-09-10 DIAGNOSIS — R6889 Other general symptoms and signs: Secondary | ICD-10-CM | POA: Diagnosis present

## 2016-09-10 DIAGNOSIS — J45909 Unspecified asthma, uncomplicated: Secondary | ICD-10-CM | POA: Diagnosis not present

## 2016-09-10 DIAGNOSIS — J309 Allergic rhinitis, unspecified: Secondary | ICD-10-CM

## 2016-09-10 MED ORDER — FLUTICASONE PROPIONATE 50 MCG/ACT NA SUSP: 1.0000 | Freq: Every day | NASAL | 0 refills | Status: DC

## 2016-09-10 MED ORDER — LORATADINE 10 MG PO TABS: 10.0000 mg | ORAL_TABLET | Freq: Every day | ORAL | 0 refills | Status: DC

## 2016-09-10 NOTE — ED Notes (Signed)
Pt is in stable condition upon d/c and ambulates from ED. 

## 2016-09-10 NOTE — ED Provider Notes (Signed)
[place] DEPT Provider Note   CSN: [id number] Arrival date & time: 09/10/16  1220  History   Chief Complaint Chief Complaint  Patient presents with  . multiple complaints    HPI Cody [name] is a 25 y.o. male.  HPI  25 y.o. male with a hx of Asthma, presents to the Emergency Department today with multiple complaints: 1) Notes back pain x 1 years notes lifting on bench press during that time and straining his back muscle on lower back. Pt able to walk without difficulty. No numbness/tingling. No loss of bowel or bladder function. No saddle anesthesia. Improves throughout the day with OTC medication. No fevers. No urinary symptoms.  2) H/A x 1 month with associated sinus congestion. No LOC or syncopal events. Notes headache is throbbing sensation circumferentially. No hx CVA/Stroke. No TBI. Used honey that his mother tried for sinuses with improvement of symptoms. Notes no headache currently. Pt believes it to be related to sinuses. No earaches. No nasal congestion. No sore throat. No fevers. Full ROM of neck without difficulty. Notes cough intermittently that resolved today. Dry in nature.  3) Pt also notes left chest muscle pain that started yesterday. Pt states that he was lifting on the bench press and felt am muscle pull. Notes occasional numbness over nipple that goes away. No shortness of breath. No diaphoresis. No hx ACS. No cardiac hx. No other symptoms noted.      Past Medical History:  Diagnosis Date  . Asthma    childhood    There are no active problems to display for this patient.   Past Surgical History:  Procedure Laterality Date  . CIRCUMCISION N/A 03/23/2013   Procedure: Circumcision  ;  Surgeon: Alexis Frock, MD;  Location: [facility];  Service: Urology;  Laterality: N/A;  with block       Home Medications    Prior to Admission medications   Medication Sig Start Date End Date Taking? Authorizing Provider  albuterol (PROVENTIL  HFA;VENTOLIN HFA) 108 (90 Base) MCG/ACT inhaler Inhale 2 puffs into the lungs every 4 (four) hours as needed for wheezing or shortness of breath. 02/29/16   Lajean Saver, MD  azithromycin (ZITHROMAX) 250 MG tablet Take 1 tablet (250 mg total) by mouth daily. Take first 2 tablets together, then 1 every day until finished. 02/15/15   Marissa Sciacca, PA-C  DM-Doxylamine-Acetaminophen (NYQUIL COLD & FLU PO) Take 1 tablet by mouth daily as needed (cold symptoms).    Historical Provider, MD  methocarbamol (ROBAXIN) 500 MG tablet Take 1 tablet (500 mg total) by mouth 2 (two) times daily. 08/05/16   Olivia Canter Sam, PA-C  naproxen (NAPROSYN) 500 MG tablet Take 1 tablet (500 mg total) by mouth 2 (two) times daily. 08/05/16   Olivia Canter Sam, PA-C  predniSONE (DELTASONE) 20 MG tablet 3 tabs po day one, then 2 tabs daily x 4 days 02/15/15   Jamse Mead, PA-C    Family History History reviewed. No pertinent family history.  Social History Social History  Substance Use Topics  . Smoking status: Never Smoker  . Smokeless tobacco: Never Used  . Alcohol use No     Allergies   Penicillins and Zyrtec [cetirizine]   Review of Systems Review of Systems ROS reviewed and all are negative for acute change except as noted in the HPI.    Physical Exam Updated Vital Signs BP 133/71   Pulse 70   Temp 98.3 F (36.8 C) (Oral)   Resp 22  SpO2 99%   Physical Exam  Constitutional: He is oriented to person, place, and time. He appears well-developed and well-nourished. No distress.  HENT:  Head: Normocephalic and atraumatic.  Right Ear: Tympanic membrane, external ear and ear canal normal.  Left Ear: Tympanic membrane, external ear and ear canal normal.  Nose: Nose normal.  Mouth/Throat: Uvula is midline, oropharynx is clear and moist and mucous membranes are normal. No trismus in the jaw. No oropharyngeal exudate, posterior oropharyngeal erythema or tonsillar abscesses.  Eyes: EOM are normal. Pupils are  equal, round, and reactive to light.  Neck: Normal range of motion. Neck supple. No tracheal deviation present.  Cardiovascular: Normal rate, regular rhythm, S1 normal, S2 normal, normal heart sounds, intact distal pulses and normal pulses.   Pulmonary/Chest: Effort normal and breath sounds normal. No respiratory distress. He has no decreased breath sounds. He has no wheezes. He has no rhonchi. He has no rales.  Abdominal: Normal appearance and bowel sounds are normal. There is no tenderness.  Musculoskeletal: Normal range of motion.  Neurological: He is alert and oriented to person, place, and time.  Skin: Skin is warm and dry.  Psychiatric: He has a normal mood and affect. His speech is normal and behavior is normal. Thought content normal.   ED Treatments / Results  Labs (all labs ordered are listed, but only abnormal results are displayed) Labs Reviewed - No data to display  EKG  EKG Interpretation None      Radiology No results found.  Procedures Procedures (including critical care time)  Medications Ordered in ED Medications - No data to display   Initial Impression / Assessment and Plan / ED Course  I have reviewed the triage vital signs and the nursing notes.  Pertinent labs & imaging results that were available during my care of the patient were reviewed by me and considered in my medical decision making (see chart for details).  Clinical Course    Final Clinical Impressions(s) / ED Diagnoses  I have reviewed the relevant previous healthcare records. I obtained HPI from historian.  ED Course:  Assessment: Pt is a 25yM who presents with multiple complaints. On exam, pt in NAD. Nontoxic/nonseptic appearing. VSS. Afebrile. Lungs CTA. Heart RRR. Abdomen nontender soft. Pt well appearing. Chest pain is not likely of cardiac or pulmonary etiology d/t presentation, perc negative, VSS, no tracheal deviation, no JVD or new murmur, RRR, breath sounds equal bilaterally. back  pain. Likely musculoskeletal from lifting injury yesterday. Back pain with no neurological deficits appreciated. Patient is ambulatory. No warning symptoms of back pain including: fecal incontinence, urinary retention or overflow incontinence, night sweats, waking from sleep with back pain, unexplained fevers or weight loss, h/o cancer, IVDU, recent trauma. No concern for cauda equina, epidural abscess, or other serious cause of back pain. Conservative measures such as rest, ice/heat and pain medicine indicated with PCP follow-up if no improvement with conservative management. Will treat for allergic sinusitis with Flonase and zyrtec with follow up to PCP   Disposition/Plan:  DC Home Additional Verbal discharge instructions given and discussed with patient.  Pt Instructed to f/u with PCP in the next week for evaluation and treatment of symptoms. Return precautions given Pt acknowledges and agrees with plan  Supervising Physician Varney Biles, MD   Final diagnoses:  Allergic sinusitis  Musculoskeletal pain    New Prescriptions New Prescriptions   No medications on file     Shary Decamp, PA-C 09/10/16 1650    Varney Biles, MD  09/11/16 0309  

## 2016-09-10 NOTE — Discharge Instructions (Signed)
Please read and follow all provided instructions.  Your diagnoses today include:  1. Allergic sinusitis   2. Musculoskeletal pain     Tests performed today include: Vital signs. See below for your results today.   Medications prescribed:  Take as prescribed   Home care instructions:  Follow any educational materials contained in this packet.  Follow-up instructions: Please follow-up with your primary care provider for further evaluation of symptoms and treatment   Return instructions:  Please return to the Emergency Department if you do not get better, if you get worse, or new symptoms OR  - Fever (temperature greater than 101.59F)  - Bleeding that does not stop with holding pressure to the area    -Severe pain (please note that you may be more sore the day after your accident)  - Chest Pain  - Difficulty breathing  - Severe nausea or vomiting  - Inability to tolerate food and liquids  - Passing out  - Skin becoming red around your wounds  - Change in mental status (confusion or lethargy)  - New numbness or weakness    Please return if you have any other emergent concerns.  Additional Information:  Your vital signs today were: BP 133/71    Pulse 70    Temp 98.3 F (36.8 C) (Oral)    Resp 22    SpO2 99%  If your blood pressure (BP) was elevated above 135/85 this visit, please have this repeated by your doctor within one month. ---------------

## 2016-09-10 NOTE — ED Notes (Signed)
Pt states he feels like there is fluid stuck in his neck and he can hear it moving from his neck to ear. Pt states he thinks he is having sinus issues.

## 2016-09-10 NOTE — ED Triage Notes (Signed)
Pt reports chronic back pain since the beginning of the year. Pt reports headaches over the last month. Pt reports taking over the counter meds with little relief. Pt reports yesterday had chest pain and left arm numbness. Reports some cough. Bilateral foot pain and numbness since the beginning of the year. Pt awake, alert, oriented x4, stroke scale neg. VSS.

## 2016-11-14 ENCOUNTER — Emergency Department (HOSPITAL_COMMUNITY)
Admission: EM | Admit: 2016-11-14 | Discharge: 2016-11-14 | Disposition: A | Discharge status: Home / Self Care | Payer: Medicaid Other | Attending: Emergency Medicine | Admitting: Emergency Medicine

## 2016-11-14 ENCOUNTER — Encounter (HOSPITAL_COMMUNITY): Payer: Self-pay

## 2016-11-14 ENCOUNTER — Emergency Department (HOSPITAL_COMMUNITY): Payer: Medicaid Other

## 2016-11-14 DIAGNOSIS — B9789 Other viral agents as the cause of diseases classified elsewhere: Secondary | ICD-10-CM

## 2016-11-14 DIAGNOSIS — J069 Acute upper respiratory infection, unspecified: Secondary | ICD-10-CM | POA: Diagnosis not present

## 2016-11-14 DIAGNOSIS — J309 Allergic rhinitis, unspecified: Secondary | ICD-10-CM | POA: Insufficient documentation

## 2016-11-14 DIAGNOSIS — R05 Cough: Secondary | ICD-10-CM | POA: Diagnosis present

## 2016-11-14 MED ORDER — BENZONATATE 100 MG PO CAPS: 100.0000 mg | ORAL_CAPSULE | Freq: Three times a day (TID) | ORAL | 0 refills | Status: DC | PRN

## 2016-11-14 MED ORDER — OMEPRAZOLE 20 MG PO CPDR: 20.0000 mg | DELAYED_RELEASE_CAPSULE | Freq: Every day | ORAL | 0 refills | Status: DC

## 2016-11-14 MED ORDER — NAPROXEN 250 MG PO TABS: 250.0000 mg | ORAL_TABLET | Freq: Two times a day (BID) | ORAL | 0 refills | Status: DC

## 2016-11-14 MED ORDER — TRIAMCINOLONE ACETONIDE 55 MCG/ACT NA AERO: 2.0000 | INHALATION_SPRAY | Freq: Every day | NASAL | 1 refills | Status: DC

## 2016-11-14 MED ORDER — LORATADINE 10 MG PO TABS: 10.0000 mg | ORAL_TABLET | Freq: Every day | ORAL | 1 refills | Status: DC

## 2016-11-14 NOTE — ED Triage Notes (Signed)
Pt reports he has had a cough X2 months. Pt states he has tried OTC medications with no relief.

## 2016-11-14 NOTE — ED Notes (Signed)
Returned to room.

## 2016-11-14 NOTE — ED Provider Notes (Signed)
[place] DEPT Provider Note    By signing my name below, I, Bea Graff, attest that this documentation has been prepared under the direction and in the presence of Will Rexine Gowens, PA-C. Electronically Signed: Bea Graff, ED Scribe. 11/14/16. 1:15 PM.    History   Chief Complaint Chief Complaint  Patient presents with  . Cough   The history is provided by the patient and medical records. No language interpreter was used.    HPI Comments:  Cody [name] is a 25 y.o. male with PMHx of childhood asthma who presents to the Emergency Department complaining of a cough that began about two months ago. He reports associated nasal congestion, post nasal drip, sneezing, runny nose, and bilateral otalgia. He reports occasional acid reflux as well.  He reports using his MDI, last dose PTA- but does not think this helps. He does not feel to be wheezing. Coughing increases his myalgias. He denies alleviating factors. He denies abdominal pain, SOB, wheezing, rashes, nausea, vomiting, fever, chills, belching. He has an appt with his PCP in January.   Past Medical History:  Diagnosis Date  . Asthma    childhood    There are no active problems to display for this patient.   Past Surgical History:  Procedure Laterality Date  . CIRCUMCISION N/A 03/23/2013   Procedure: Circumcision  ;  Surgeon: Alexis Frock, MD;  Location: [facility];  Service: Urology;  Laterality: N/A;  with block       Home Medications    Prior to Admission medications   Medication Sig Start Date End Date Taking? Authorizing Provider  albuterol (PROVENTIL HFA;VENTOLIN HFA) 108 (90 Base) MCG/ACT inhaler Inhale 2 puffs into the lungs every 4 (four) hours as needed for wheezing or shortness of breath. 02/29/16   Lajean Saver, MD  benzonatate (TESSALON) 100 MG capsule Take 1 capsule (100 mg total) by mouth 3 (three) times daily as needed for cough. 11/14/16   Waynetta Pean, PA-C  loratadine  (CLARITIN) 10 MG tablet Take 1 tablet (10 mg total) by mouth daily. 11/14/16   Waynetta Pean, PA-C  naproxen (NAPROSYN) 250 MG tablet Take 1 tablet (250 mg total) by mouth 2 (two) times daily with a meal. 11/14/16   Waynetta Pean, PA-C  omeprazole (PRILOSEC) 20 MG capsule Take 1 capsule (20 mg total) by mouth daily. 11/14/16   Waynetta Pean, PA-C  triamcinolone (NASACORT AQ) 55 MCG/ACT AERO nasal inhaler Place 2 sprays into the nose daily. 11/14/16   Waynetta Pean, PA-C    Family History History reviewed. No pertinent family history.  Social History Social History  Substance Use Topics  . Smoking status: Never Smoker  . Smokeless tobacco: Never Used  . Alcohol use No     Allergies   Penicillins and Zyrtec [cetirizine]   Review of Systems Review of Systems  Constitutional: Negative for chills and fever.  HENT: Positive for congestion, ear pain, postnasal drip, rhinorrhea, sinus pressure and sneezing. Negative for mouth sores, sore throat and trouble swallowing.   Eyes: Negative for visual disturbance.  Respiratory: Positive for cough. Negative for shortness of breath and wheezing.   Gastrointestinal: Negative for abdominal pain, nausea and vomiting.  Musculoskeletal: Positive for myalgias.  Skin: Negative for rash.  Neurological: Negative for light-headedness and headaches.     Physical Exam Updated Vital Signs BP 142/83 (BP Location: Left Arm)   Pulse 74   Temp 98.2 F (36.8 C) (Oral)   Resp 18   SpO2 100%  Physical Exam  Constitutional: He appears well-developed and well-nourished. No distress.  Nontoxic appearing.  HENT:  Head: Normocephalic and atraumatic.  Right Ear: External ear normal. Tympanic membrane is not erythematous. A middle ear effusion is present.  Left Ear: External ear normal. Tympanic membrane is not erythematous. A middle ear effusion is present.  Mouth/Throat: Oropharynx is clear and moist. No oropharyngeal exudate.  Mild middle ear  effusion bilaterally but no TM erythema or loss of landmarks. Boggy nasal turbinates bilaterally.  Throat is clear. Evidence of postnasal drip.  Eyes: Conjunctivae are normal. Pupils are equal, round, and reactive to light. Right eye exhibits no discharge. Left eye exhibits no discharge.  Neck: Normal range of motion. Neck supple. No JVD present. No tracheal deviation present.  Cardiovascular: Normal rate, regular rhythm, normal heart sounds and intact distal pulses.   Pulmonary/Chest: Effort normal and breath sounds normal. No stridor. No respiratory distress. He has no wheezes. He has no rales.  Lungs clear to auscultation bilaterally.  Abdominal: Soft. There is no tenderness. There is no guarding.  Lymphadenopathy:    He has no cervical adenopathy.  Neurological: He is alert. Coordination normal.  Skin: Skin is warm and dry. Capillary refill takes less than 2 seconds. No rash noted. He is not diaphoretic. No erythema. No pallor.  Psychiatric: He has a normal mood and affect. His behavior is normal.  Nursing note and vitals reviewed.    ED Treatments / Results  DIAGNOSTIC STUDIES: Oxygen Saturation is 100% on RA, normal by my interpretation.   COORDINATION OF CARE: 1:13 PM- Will prescribe Rhinocort nasal spray, Omeprazole and Naprosyn. Pt verbalizes understanding and agrees to plan.  Medications - No data to display  Labs (all labs ordered are listed, but only abnormal results are displayed) Labs Reviewed - No data to display  EKG  EKG Interpretation None       Radiology Dg Chest 2 View  Result Date: 11/14/2016 CLINICAL DATA:  Cough for 2 months EXAM: CHEST  2 VIEW COMPARISON:  02/29/2016 FINDINGS: The heart size and mediastinal contours are within normal limits. Both lungs are clear. The visualized skeletal structures are unremarkable. IMPRESSION: No active cardiopulmonary disease. Electronically Signed   By: Inez Catalina M.D.   On: 11/14/2016 12:59     Procedures Procedures (including critical care time)  Medications Ordered in ED Medications - No data to display   Initial Impression / Assessment and Plan / ED Course  I have reviewed the triage vital signs and the nursing notes.  Pertinent labs & imaging results that were available during my care of the patient were reviewed by me and considered in my medical decision making (see chart for details).  Clinical Course     Pt symptoms consistent with URI. Pt reports an ongoing cough for the past two months. CXR negative for acute infiltrate. Pt will be discharged with symptomatic treatment. We'll discharge with Nasacort, Claritin, Naprosyn, Tessalon Perles and omeprazole. Patient reports some symptoms of acid reflux for this could contribute to a cough lasting for 2 months. I think this is most likely from postnasal drip. Discussed return precautions.  Pt is hemodynamically stable & in NAD prior to discharge. I advised the patient to follow-up with their primary care provider this week. I advised the patient to return to the emergency department with new or worsening symptoms or new concerns. The patient verbalized understanding and agreement with plan.     I personally performed the services described in this documentation, which  was scribed in my presence. The recorded information has been reviewed and is accurate.     Final Clinical Impressions(s) / ED Diagnoses   Final diagnoses:  Viral URI with cough  Acute allergic rhinitis, unspecified seasonality, unspecified trigger    New Prescriptions New Prescriptions   BENZONATATE (TESSALON) 100 MG CAPSULE    Take 1 capsule (100 mg total) by mouth 3 (three) times daily as needed for cough.   NAPROXEN (NAPROSYN) 250 MG TABLET    Take 1 tablet (250 mg total) by mouth 2 (two) times daily with a meal.   OMEPRAZOLE (PRILOSEC) 20 MG CAPSULE    Take 1 capsule (20 mg total) by mouth daily.   TRIAMCINOLONE (NASACORT AQ) 55 MCG/ACT AERO NASAL  INHALER    Place 2 sprays into the nose daily.     Waynetta Pean, PA-C 11/14/16 [place], MD 11/15/16 [phone]

## 2017-03-05 ENCOUNTER — Encounter (HOSPITAL_COMMUNITY): Payer: Self-pay | Admitting: Emergency Medicine

## 2017-03-05 ENCOUNTER — Emergency Department (HOSPITAL_COMMUNITY)
Admission: EM | Admit: 2017-03-05 | Discharge: 2017-03-05 | Disposition: A | Discharge status: Home / Self Care | Payer: Medicaid Other | Attending: Emergency Medicine | Admitting: Emergency Medicine

## 2017-03-05 DIAGNOSIS — J45909 Unspecified asthma, uncomplicated: Secondary | ICD-10-CM | POA: Insufficient documentation

## 2017-03-05 DIAGNOSIS — H73891 Other specified disorders of tympanic membrane, right ear: Secondary | ICD-10-CM | POA: Diagnosis not present

## 2017-03-05 DIAGNOSIS — R0981 Nasal congestion: Secondary | ICD-10-CM | POA: Insufficient documentation

## 2017-03-05 DIAGNOSIS — H6593 Unspecified nonsuppurative otitis media, bilateral: Secondary | ICD-10-CM

## 2017-03-05 DIAGNOSIS — L989 Disorder of the skin and subcutaneous tissue, unspecified: Secondary | ICD-10-CM | POA: Insufficient documentation

## 2017-03-05 DIAGNOSIS — H9201 Otalgia, right ear: Secondary | ICD-10-CM | POA: Diagnosis present

## 2017-03-05 NOTE — Discharge Instructions (Signed)
Continue taking Claritin and nose spray daily. Please see your primary care provider if your symptoms do not improve in the next 4-5 days. Return to the ER for new or worsening symptoms, any additional concerns.

## 2017-03-05 NOTE — ED Provider Notes (Signed)
[place] DEPT Provider Note   CSN: [id number] Arrival date & time: 03/05/17  1000  By signing my name below, I, Cody [name], attest that this documentation has been prepared under the direction and in the presence of [facility], PA-C . Electronically Signed: Higinio [name], Scribe. 03/05/2017. 10:57 AM.  History   Chief Complaint Chief Complaint  Patient presents with  . Abscess   The history is provided by the patient. No language interpreter was used.   HPI Comments: Cody [name] is a 26 y.o. male with PMHx of GERD who presents to the Emergency Department for an evaluation of an intermittently painful "sore" to his right posterior scalp that began ~2 months ago. Pt reports he "picks at it" often which temporarily relieves his pain but causes him to "get a bitter taste in my mouth." He notes associated headaches secondary to his pain directly over the sore. He states he visited his PCP ~1 month ago for similar pain in which he was prescribed a 14-day course of an unknown antibiotic which helped with symptoms. He reports he visited another walk-in clinic ~1 week ago for similar symptoms and was told that "everything looked fine." He states he visits the ED today because he wants to make sure he is "not pushing any fluid from his sore into the back of his mouth." Pt also complains of persistent, sensation of right ear fullness that began 2 days ago. He notes associated sinus pressure and congestion. He admits to using q-tips often to clean out his ears. He states he has taken Claritin and Flonase evert day to relieve his symptoms with no relief. Pt denies any drainage from his sore, hearing changes, sore throat, nausea, and fever.   Past Medical History:  Diagnosis Date  . Asthma    childhood   There are no active problems to display for this patient.  Past Surgical History:  Procedure Laterality Date  . CIRCUMCISION N/A 03/23/2013   Procedure: Circumcision  ;  Surgeon: Alexis Frock, MD;   Location: [facility];  Service: Urology;  Laterality: N/A;  with block    Home Medications    Prior to Admission medications   Medication Sig Start Date End Date Taking? Authorizing Provider  albuterol (PROVENTIL HFA;VENTOLIN HFA) 108 (90 Base) MCG/ACT inhaler Inhale 2 puffs into the lungs every 4 (four) hours as needed for wheezing or shortness of breath. 02/29/16   Lajean Saver, MD  benzonatate (TESSALON) 100 MG capsule Take 1 capsule (100 mg total) by mouth 3 (three) times daily as needed for cough. 11/14/16   Waynetta Pean, PA-C  loratadine (CLARITIN) 10 MG tablet Take 1 tablet (10 mg total) by mouth daily. 11/14/16   Waynetta Pean, PA-C  naproxen (NAPROSYN) 250 MG tablet Take 1 tablet (250 mg total) by mouth 2 (two) times daily with a meal. 11/14/16   Waynetta Pean, PA-C  omeprazole (PRILOSEC) 20 MG capsule Take 1 capsule (20 mg total) by mouth daily. 11/14/16   Waynetta Pean, PA-C  triamcinolone (NASACORT AQ) 55 MCG/ACT AERO nasal inhaler Place 2 sprays into the nose daily. 11/14/16   Waynetta Pean, PA-C    Family History No family history on file.  Social History Social History  Substance Use Topics  . Smoking status: Never Smoker  . Smokeless tobacco: Never Used  . Alcohol use No   Allergies   Penicillins and Zyrtec [cetirizine]  Review of Systems Review of Systems  Constitutional: Negative for fever.  HENT: Positive for congestion, ear  pain (Fullness) and sinus pressure. Negative for hearing loss and sore throat.   Gastrointestinal: Negative for nausea.  Skin: Positive for wound.  Neurological: Positive for headaches.   Physical Exam Updated Vital Signs BP 135/83 (BP Location: Left Arm)   Pulse 70   Temp 97.7 F (36.5 C) (Oral)   Resp 16   Ht 5\' 11"  (1.803 m)   Wt 220 lb (99.8 kg)   SpO2 100%   BMI 30.68 kg/m   Physical Exam  Constitutional: He is oriented to person, place, and time. He appears well-developed and well-nourished.  HENT:    Head: Normocephalic.  Mouth/Throat: Oropharynx is clear and moist. No oropharyngeal exudate.  0.5 cm scabbed over lesion with no fluctuance, tenderness, swelling or redness. Fluid behind bilateral ears, no signs of infection. No mastoid tenderness.   Eyes: EOM are normal.  Neck: Normal range of motion. Neck supple.  Full ROM without pain. No cervical or occipital lymphadenopathy.    Pulmonary/Chest: Effort normal.  Abdominal: He exhibits no distension.  Musculoskeletal: Normal range of motion.  Neurological: He is alert and oriented to person, place, and time.  Psychiatric: He has a normal mood and affect.  Nursing note and vitals reviewed.  ED Treatments / Results  DIAGNOSTIC STUDIES:  Oxygen Saturation is 100% on RA, normal by my interpretation.    COORDINATION OF CARE:  10:34 AM Discussed treatment [name] with pt at bedside and pt agreed to [name].  Labs (all labs ordered are listed, but only abnormal results are displayed) Labs Reviewed - No data to display  EKG  EKG Interpretation None       Radiology No results found.  Procedures Procedures (including critical care time)  Medications Ordered in ED Medications - No data to display  Initial Impression / Assessment and [name] / ED Course  I have reviewed the triage vital signs and the nursing notes.  Pertinent labs & imaging results that were available during my care of the patient were reviewed by me and considered in my medical decision making (see chart for details).     Cody [name] is a 26 y.o. male who presents to ED for two complaints:  1. Sore to posterior head x 1 month. Seen by PCP and successfully completed course of ABX with improvement. Seen by urgent care one week ago and told everything was fine. On exam, there is a scab, but no signs of abscess or infection. Appears the area is indeed healing well. Encouraged him to continue to keep area clean, but everything seems to be healing as expected.   2.  Ear fullness. Fluid behind TM's but no signs of infection. Was instructed to start claritin and nasal spray daily by urgent care last week. Told him to continue to do so. He is afebrile, lungs CTA, OP clear and no focal areas of sinus tenderness.   PCP follow up early next week if symptoms not improved. Patient feels comfortable with [name] as dictated above. All questions answered.    I personally performed the services described in this documentation, which was scribed in my presence. The recorded information has been reviewed and is accurate.   Final Clinical Impressions(s) / ED Diagnoses   Final diagnoses:  Nasal congestion  Skin lesion  Fluid level behind tympanic membrane of both ears    New Prescriptions Discharge Medication List as of 03/05/2017 10:57 AM       [place], PA-C 03/05/17 1116    Fredia Sorrow, MD 03/06/17 1103

## 2017-03-05 NOTE — ED Triage Notes (Signed)
Pt reports lesion present to scalp on back right side of head that pt states has been present for "months" pt states when he picks at it he "gets a bitter taste in my mouth." denies fever or chills. Small white bump noted to scalp.

## 2017-06-10 ENCOUNTER — Encounter (HOSPITAL_COMMUNITY): Payer: Self-pay

## 2017-06-10 ENCOUNTER — Emergency Department (HOSPITAL_COMMUNITY)
Admission: EM | Admit: 2017-06-10 | Discharge: 2017-06-10 | Disposition: A | Discharge status: Home / Self Care | Payer: Medicaid Other | Attending: Emergency Medicine | Admitting: Emergency Medicine

## 2017-06-10 DIAGNOSIS — L989 Disorder of the skin and subcutaneous tissue, unspecified: Secondary | ICD-10-CM | POA: Diagnosis present

## 2017-06-10 DIAGNOSIS — R238 Other skin changes: Secondary | ICD-10-CM | POA: Insufficient documentation

## 2017-06-10 DIAGNOSIS — J45909 Unspecified asthma, uncomplicated: Secondary | ICD-10-CM | POA: Insufficient documentation

## 2017-06-10 DIAGNOSIS — Z79899 Other long term (current) drug therapy: Secondary | ICD-10-CM | POA: Insufficient documentation

## 2017-06-10 NOTE — Discharge Instructions (Signed)
You have a localize skin irritation on your scalp.  Avoid picking at it.  If you notice increasing pain, increasing size or if you have other concerns please follow up with your doctor for further care.

## 2017-06-10 NOTE — ED Provider Notes (Signed)
[place] DEPT Provider Note   CSN: [id number] Arrival date & time: 06/10/17  9381  By signing my name below, I, Collene Leyden, attest that this documentation has been prepared under the direction and in the presence of Domenic Moras, PA-C. Electronically Signed: Collene Leyden, Scribe. 06/10/17. 10:21 AM.  History   Chief Complaint Chief Complaint  Patient presents with  . Bump on head   HPI Comments: Cody [name] is a 26 y.o. male with no pertinent past medical history, who presents to the Emergency Department complaining of a gradual-onset "sore" to the posterior head that appeared 2-3 months ago. Patient states he has had a 5/10 mild burning sensation to the posterior head. Patient states he has been "picking" at the area causing a scab formation. Patient denies any injury or trauma. No additional symptoms noted. No modifying factors indicated. Patient denies any fever, chills, numbness, weakness, or any additional symptoms.   The history is provided by the patient. No language interpreter was used.    Past Medical History:  Diagnosis Date  . Asthma    childhood    There are no active problems to display for this patient.   Past Surgical History:  Procedure Laterality Date  . CIRCUMCISION N/A 03/23/2013   Procedure: Circumcision  ;  Surgeon: Alexis Frock, MD;  Location: [facility];  Service: Urology;  Laterality: N/A;  with block       Home Medications    Prior to Admission medications   Medication Sig Start Date End Date Taking? Authorizing Provider  albuterol (PROVENTIL HFA;VENTOLIN HFA) 108 (90 Base) MCG/ACT inhaler Inhale 2 puffs into the lungs every 4 (four) hours as needed for wheezing or shortness of breath. 02/29/16   Lajean Saver, MD  benzonatate (TESSALON) 100 MG capsule Take 1 capsule (100 mg total) by mouth 3 (three) times daily as needed for cough. 11/14/16   Waynetta Pean, PA-C  loratadine (CLARITIN) 10 MG tablet Take 1 tablet (10 mg  total) by mouth daily. 11/14/16   Waynetta Pean, PA-C  naproxen (NAPROSYN) 250 MG tablet Take 1 tablet (250 mg total) by mouth 2 (two) times daily with a meal. 11/14/16   Waynetta Pean, PA-C  omeprazole (PRILOSEC) 20 MG capsule Take 1 capsule (20 mg total) by mouth daily. 11/14/16   Waynetta Pean, PA-C  triamcinolone (NASACORT AQ) 55 MCG/ACT AERO nasal inhaler Place 2 sprays into the nose daily. 11/14/16   Waynetta Pean, PA-C    Family History No family history on file.  Social History Social History  Substance Use Topics  . Smoking status: Never Smoker  . Smokeless tobacco: Never Used  . Alcohol use No     Allergies   Penicillins and Zyrtec [cetirizine]   Review of Systems Review of Systems  Constitutional: Negative for chills and fever.  Skin:       Bump to the posterior scalp.  Neurological: Negative for weakness and numbness.     Physical Exam Updated Vital Signs BP 136/85 (BP Location: Left Arm)   Pulse 87   Temp 97.5 F (36.4 C) (Oral)   Resp 18   SpO2 100%   Physical Exam  Constitutional: He is oriented to person, place, and time. He appears well-developed.  HENT:  Head: Normocephalic and atraumatic.  Mouth/Throat: Oropharynx is clear and moist.  On the posterior scalp near the occipital region that is a 3 mm area of mild induration. No fluctuance, erythema, or alopecia.   Eyes: Conjunctivae and EOM are normal. Pupils are  equal, round, and reactive to light.  Neck: Normal range of motion. Neck supple.  Cardiovascular: Normal rate.   Pulmonary/Chest: Effort normal.  Abdominal: Soft. Bowel sounds are normal.  Musculoskeletal: Normal range of motion.  Neurological: He is alert and oriented to person, place, and time.  Skin: Skin is warm and dry.  Psychiatric: He has a normal mood and affect.  Nursing note and vitals reviewed.    ED Treatments / Results  DIAGNOSTIC STUDIES: Oxygen Saturation is 100% on RA, normal by my interpretation.     COORDINATION OF CARE: 11:00 AM Discussed treatment plan with pt at bedside and pt agreed to plan.  Labs (all labs ordered are listed, but only abnormal results are displayed) Labs Reviewed - No data to display  EKG  EKG Interpretation None       Radiology No results found.  Procedures Procedures (including critical care time)  Medications Ordered in ED Medications - No data to display   Initial Impression / Assessment and Plan / ED Course  I have reviewed the triage vital signs and the nursing notes.  Pertinent labs & imaging results that were available during my care of the patient were reviewed by me and considered in my medical decision making (see chart for details).     BP 136/85 (BP Location: Left Arm)   Pulse 87   Temp 97.5 F (36.4 C) (Oral)   Resp 18   SpO2 100%    Final Clinical Impressions(s) / ED Diagnoses   Final diagnoses:  Scalp irritation    New Prescriptions New Prescriptions   No medications on file   I personally performed the services described in this documentation, which was scribed in my presence. The recorded information has been reviewed and is accurate.   11:11 AM Localized skin irritation on posterior scalp, likely an acne or potential keratosis skin changes, no evidence of infection, doubt malignancy and doubt fungal infection.  Not an abscess.  Recommend avoid picking at it and to monitor for changes.      Domenic Moras, PA-C 06/10/17 1112    Daleen Bo, MD 06/10/17 [phone]

## 2017-06-10 NOTE — ED Triage Notes (Signed)
Pt reports a small "bump, like a sore" on the back of his head that he noticed months ago and it wont go away

## 2017-06-27 ENCOUNTER — Encounter (HOSPITAL_COMMUNITY): Payer: Self-pay

## 2017-06-27 ENCOUNTER — Emergency Department (HOSPITAL_COMMUNITY): Payer: Medicaid Other

## 2017-06-27 ENCOUNTER — Emergency Department (HOSPITAL_COMMUNITY)
Admission: EM | Admit: 2017-06-27 | Discharge: 2017-06-27 | Disposition: A | Discharge status: Home / Self Care | Payer: Medicaid Other | Attending: Emergency Medicine | Admitting: Emergency Medicine

## 2017-06-27 DIAGNOSIS — H938X2 Other specified disorders of left ear: Secondary | ICD-10-CM | POA: Diagnosis not present

## 2017-06-27 DIAGNOSIS — R0981 Nasal congestion: Secondary | ICD-10-CM | POA: Insufficient documentation

## 2017-06-27 DIAGNOSIS — Z79899 Other long term (current) drug therapy: Secondary | ICD-10-CM | POA: Insufficient documentation

## 2017-06-27 DIAGNOSIS — R51 Headache: Secondary | ICD-10-CM | POA: Diagnosis present

## 2017-06-27 DIAGNOSIS — J45909 Unspecified asthma, uncomplicated: Secondary | ICD-10-CM | POA: Insufficient documentation

## 2017-06-27 MED ORDER — FLUTICASONE PROPIONATE 50 MCG/ACT NA SUSP: 1.0000 | Freq: Every day | NASAL | 0 refills | Status: DC

## 2017-06-27 MED ORDER — IBUPROFEN 400 MG PO TABS
600.0000 mg | ORAL_TABLET | Freq: Once | ORAL | Status: AC
  Administered 2017-06-27: 17:00:00 600 mg via ORAL
  Filled 2017-06-27: qty 1

## 2017-06-27 NOTE — ED Triage Notes (Signed)
Pt reports pain described as "pressure" from his right ear that radiated to the right side of his head. He also reports "pressure" to the right side of his neck. He states it is not pain. He is also reports back pain.

## 2017-06-27 NOTE — Discharge Instructions (Signed)
As discussed, please make sure you stay well-hydrated. Use loratadine daily and your Flonase nasal spray.  Your chest x-ray was negative and did not show pneumonia.  Follow-up with your primary care provider. Return if you experience fever, chills, body aches, worsening symptoms or new concerning symptoms in the meantime

## 2017-06-27 NOTE — ED Notes (Signed)
Patient transported to X-ray 

## 2017-06-27 NOTE — ED Provider Notes (Signed)
[place] DEPT Provider Note   CSN: [id number] Arrival date & time: 06/27/17  1401     History   Chief Complaint Chief Complaint  Patient presents with  . Headache    HPI Cody [name] is a 26 y.o. male presenting with 2 months of fullness in his neck and sinuses and ears. He reports taking loratadine without relief. He also reports a cough that is productive over the last 2 months. He does have a PCP who has seen him and prescribe him loratadine.  He denies any new symptoms at this time.  HPI  Past Medical History:  Diagnosis Date  . Asthma    childhood    There are no active problems to display for this patient.   Past Surgical History:  Procedure Laterality Date  . CIRCUMCISION N/A 03/23/2013   Procedure: Circumcision  ;  Surgeon: Alexis Frock, MD;  Location: [facility];  Service: Urology;  Laterality: N/A;  with block       Home Medications    Prior to Admission medications   Medication Sig Start Date End Date Taking? Authorizing Provider  albuterol (PROVENTIL HFA;VENTOLIN HFA) 108 (90 Base) MCG/ACT inhaler Inhale 2 puffs into the lungs every 4 (four) hours as needed for wheezing or shortness of breath. 02/29/16   Lajean Saver, MD  benzonatate (TESSALON) 100 MG capsule Take 1 capsule (100 mg total) by mouth 3 (three) times daily as needed for cough. 11/14/16   Waynetta Pean, PA-C  loratadine (CLARITIN) 10 MG tablet Take 1 tablet (10 mg total) by mouth daily. 11/14/16   Waynetta Pean, PA-C  naproxen (NAPROSYN) 250 MG tablet Take 1 tablet (250 mg total) by mouth 2 (two) times daily with a meal. 11/14/16   Waynetta Pean, PA-C  omeprazole (PRILOSEC) 20 MG capsule Take 1 capsule (20 mg total) by mouth daily. 11/14/16   Waynetta Pean, PA-C  triamcinolone (NASACORT AQ) 55 MCG/ACT AERO nasal inhaler Place 2 sprays into the nose daily. 11/14/16   Waynetta Pean, PA-C    Family History No family history on file.  Social History Social  History  Substance Use Topics  . Smoking status: Never Smoker  . Smokeless tobacco: Never Used  . Alcohol use No     Allergies   Penicillins and Zyrtec [cetirizine]   Review of Systems Review of Systems  Constitutional: Negative for chills, fatigue and fever.  HENT: Positive for congestion, postnasal drip and sinus pressure. Negative for ear discharge, ear pain, facial swelling, sore throat, trouble swallowing and voice change.   Eyes: Negative for pain and visual disturbance.  Respiratory: Negative for cough, choking, chest tightness, shortness of breath, wheezing and stridor.   Cardiovascular: Negative for chest pain and palpitations.  Gastrointestinal: Negative for abdominal pain, diarrhea, nausea and vomiting.  Genitourinary: Negative for dysuria and hematuria.  Musculoskeletal: Positive for back pain. Negative for arthralgias, myalgias, neck pain and neck stiffness.       Chronic  Skin: Negative for color change, pallor and rash.  Neurological: Negative for dizziness, seizures, syncope, weakness and light-headedness.     Physical Exam Updated Vital Signs BP 130/73 (BP Location: Left Arm)   Pulse 68   Temp 97.9 F (36.6 C) (Oral)   Resp 18   Ht 5\' 11"  (1.803 m)   Wt 99.8 kg (220 lb)   SpO2 100%   BMI 30.68 kg/m   Physical Exam  Constitutional: He appears well-developed and well-nourished. No distress.  Afebrile, nontoxic-appearing, sitting comfortably in bed in  no acute distress.  HENT:  Head: Normocephalic and atraumatic.  Mouth/Throat: Oropharynx is clear and moist. No oropharyngeal exudate.  Eyes: Conjunctivae are normal. Right eye exhibits no discharge. Left eye exhibits no discharge.  Neck: Normal range of motion. Neck supple.  Cardiovascular: Normal rate, regular rhythm and normal heart sounds.   No murmur heard. Pulmonary/Chest: Effort normal and breath sounds normal. No respiratory distress. He has no wheezes. He has no rales. He exhibits no tenderness.    Abdominal: He exhibits no distension.  Musculoskeletal: Normal range of motion. He exhibits no edema, tenderness or deformity.  Lymphadenopathy:    He has no cervical adenopathy.  Neurological: He is alert.  Skin: Skin is warm and dry. No rash noted. He is not diaphoretic. No erythema. No pallor.  Psychiatric: He has a normal mood and affect.  Nursing note and vitals reviewed.    ED Treatments / Results  Labs (all labs ordered are listed, but only abnormal results are displayed) Labs Reviewed - No data to display  EKG  EKG Interpretation None       Radiology No results found.  Procedures Procedures (including critical care time)  Medications Ordered in ED Medications  ibuprofen (ADVIL,MOTRIN) tablet 600 mg (600 mg Oral Given 06/27/17 1729)     Initial Impression / Assessment and Plan / ED Course  I have reviewed the triage vital signs and the nursing notes.  Pertinent labs & imaging results that were available during my care of the patient were reviewed by me and considered in my medical decision making (see chart for details).    Patient presents with chronic congestion, allergy symptoms, productive cough and sensation of fullness in his neck bilaterally and ears. No new symptoms, no fever or chills, chest pain difficulty breathing.  On exam oropharynx is clear without exudate, lungs are clear to auscultation bilaterally, negative meningeal signs.  Will obtain chest x-ray to rule out pneumonia given 2 months of productive cough. Patient given ibuprofen and will reassess.  Exam overall reassuring and unremarkable.  Discharge home with nasal spray, symptomatic relief and close PCP follow-up as needed. Patient advised to continue with loratadine given allergy to cetirizine. He is nontoxic and afebrile, well-appearing and stable prior to discharge.  Discussed strict return precautions and advised to return to the emergency department if experiencing any new or  worsening symptoms. Instructions were understood and patient agreed with discharge plan. Final Clinical Impressions(s) / ED Diagnoses   Final diagnoses:  Congestion of left ear  Chronic nasal congestion    New Prescriptions New Prescriptions   No medications on file     [name] 06/27/17 1819    Little, Wenda Overland, MD 06/28/17 [phone]

## 2017-08-04 ENCOUNTER — Encounter (HOSPITAL_COMMUNITY): Payer: Self-pay | Admitting: Emergency Medicine

## 2017-08-04 ENCOUNTER — Emergency Department (HOSPITAL_COMMUNITY)
Admission: EM | Admit: 2017-08-04 | Discharge: 2017-08-04 | Disposition: A | Discharge status: Home / Self Care | Payer: Medicaid Other | Attending: Emergency Medicine | Admitting: Emergency Medicine

## 2017-08-04 DIAGNOSIS — R05 Cough: Secondary | ICD-10-CM | POA: Diagnosis not present

## 2017-08-04 DIAGNOSIS — J45909 Unspecified asthma, uncomplicated: Secondary | ICD-10-CM | POA: Insufficient documentation

## 2017-08-04 DIAGNOSIS — J392 Other diseases of pharynx: Secondary | ICD-10-CM | POA: Diagnosis present

## 2017-08-04 DIAGNOSIS — Z79899 Other long term (current) drug therapy: Secondary | ICD-10-CM | POA: Insufficient documentation

## 2017-08-04 DIAGNOSIS — R51 Headache: Secondary | ICD-10-CM | POA: Insufficient documentation

## 2017-08-04 DIAGNOSIS — J3489 Other specified disorders of nose and nasal sinuses: Secondary | ICD-10-CM | POA: Diagnosis not present

## 2017-08-04 NOTE — ED Provider Notes (Signed)
[facility] DEPT Provider Note   CSN: [id number] Arrival date & time: 08/04/17  1623  By signing my name below, I, Margit Banda, attest that this documentation has been prepared under the direction and in the presence of Avie Echevaria, PA-C. Electronically Signed: Margit Banda, ED Scribe. 08/04/17. 7:19 PM.  History   Chief Complaint Chief Complaint  Patient presents with  . Sore Throat    HPI Cody [name] is a 26 y.o. male who presents to the Emergency Department complaining of swelling to his throat that started this morning. Pt reports he feels like his throat is numb and endorses, cough, and HA (last night). Pt is currently taking antibiotics and using Flonase for his sinuses since Wednesday, 07/29/17, with mild relief. He was prescribed antibiotics through his ENT doctor. He hasn't been taking zyrtec or any other seasonal allergies medication because of the antibiotics. Pt is concerned because his throat is feeling weird and he denies pain. Pt denies fever, chills, trouble swallowing, or any other complaints at this time.   The history is provided by the patient and a significant other. No language interpreter was used.    Past Medical History:  Diagnosis Date  . Asthma    childhood    There are no active problems to display for this patient.   Past Surgical History:  Procedure Laterality Date  . CIRCUMCISION N/A 03/23/2013   Procedure: Circumcision  ;  Surgeon: Alexis Frock, MD;  Location: [facility];  Service: Urology;  Laterality: N/A;  with block       Home Medications    Prior to Admission medications   Medication Sig Start Date End Date Taking? Authorizing Provider  albuterol (PROVENTIL HFA;VENTOLIN HFA) 108 (90 Base) MCG/ACT inhaler Inhale 2 puffs into the lungs every 4 (four) hours as needed for wheezing or shortness of breath. 02/29/16   Lajean Saver, MD  benzonatate (TESSALON) 100 MG capsule Take 1 capsule (100 mg total) by mouth 3  (three) times daily as needed for cough. 11/14/16   Waynetta Pean, PA-C  fluticasone (FLONASE) 50 MCG/ACT nasal spray Place 1 spray into both nostrils daily. 06/27/17   Avie Echevaria B, PA-C  loratadine (CLARITIN) 10 MG tablet Take 1 tablet (10 mg total) by mouth daily. 11/14/16   Waynetta Pean, PA-C  naproxen (NAPROSYN) 250 MG tablet Take 1 tablet (250 mg total) by mouth 2 (two) times daily with a meal. 11/14/16   Waynetta Pean, PA-C  omeprazole (PRILOSEC) 20 MG capsule Take 1 capsule (20 mg total) by mouth daily. 11/14/16   Waynetta Pean, PA-C  triamcinolone (NASACORT AQ) 55 MCG/ACT AERO nasal inhaler Place 2 sprays into the nose daily. 11/14/16   Waynetta Pean, PA-C    Family History History reviewed. No pertinent family history.  Social History Social History  Substance Use Topics  . Smoking status: Never Smoker  . Smokeless tobacco: Never Used  . Alcohol use No     Allergies   Penicillins and Zyrtec [cetirizine]   Review of Systems Review of Systems  Constitutional: Negative for chills and fever.  HENT: Positive for postnasal drip and sinus pressure. Negative for drooling, sore throat, trouble swallowing and voice change.        Positive throat swelling. Positive throat numbness. Negative pain.  Respiratory: Positive for cough. Negative for wheezing and stridor.   Cardiovascular: Negative for chest pain and palpitations.  Gastrointestinal: Negative for nausea and vomiting.  Musculoskeletal: Negative for neck pain and neck stiffness.  Skin: Negative for  color change and pallor.     Physical Exam Updated Vital Signs BP 124/67 (BP Location: Right Arm)   Pulse 65   Temp 98.1 F (36.7 C) (Oral)   Resp 14   SpO2 99%   Physical Exam  Constitutional: He appears well-developed and well-nourished. No distress.  Patient is afebrile, non-toxic appearing, sitting comfortably in chair in no acute distress.  HENT:  Head: Normocephalic and atraumatic.    Mouth/Throat: Posterior oropharyngeal erythema present.  No gross oral abscess. Uvula is midline, arches are simmetrical and intact. No peritonsilar swelling or exudate. No trismus. Sublingual mucosa is soft and non-tender. Tolerating oral secretions. No concern for ludwig's angina.  Eyes: Conjunctivae are normal.  Neck: Normal range of motion.  Cardiovascular: Normal rate, regular rhythm and normal heart sounds.   Pulmonary/Chest: Effort normal and breath sounds normal.  Abdominal: He exhibits no distension.  Musculoskeletal: Normal range of motion.  Neurological: He is alert.  Skin: Skin is warm and dry. No pallor.  Psychiatric: He has a normal mood and affect. His behavior is normal.  Nursing note and vitals reviewed.    ED Treatments / Results  DIAGNOSTIC STUDIES: Oxygen Saturation is 97% on RA, normal by my interpretation.   COORDINATION OF CARE: 7:19 PM-Discussed next steps with pt which includes staying hydrated, drinking warm teat with honey, throat sprays. He is to take tylenol or ibuprofen for pain as needed. Pt verbalized understanding and is agreeable with the plan.   Labs (all labs ordered are listed, but only abnormal results are displayed) Labs Reviewed - No data to display  EKG  EKG Interpretation None       Radiology No results found.  Procedures Procedures (including critical care time)  Medications Ordered in ED Medications - No data to display   Initial Impression / Assessment and Plan / ED Course  I have reviewed the triage vital signs and the nursing notes.  Pertinent labs & imaging results that were available during my care of the patient were reviewed by me and considered in my medical decision making (see chart for details).    Patient reports irritation in his throat and a numbing sensation on the outside of his neck. He googled his symptoms and is concerned that something may be wrong. No sore throat or difficulty swallowing. He reports  feeling a pressure to the front of his throat.  Overall has been improving with antibiotics. recently seen by ENT.  Exam reassuring. Rapid strep not indicated at this time. Patient has been having post nasal drip, congestion and coughing likely irritating his throat. No cervical lymphadenopathy. Tolerating oral secretions.  Patient is afebrile and non-toxic appearing. Normal vitals and stable. Provided reassurance.  Dc home with symptomatic relief and PCP, ENT follow up as needed.  Discussed strict return precautions and advised to return to the emergency department if experiencing any new or worsening symptoms. Instructions were understood and patient agreed with discharge plan.  Final Clinical Impressions(s) / ED Diagnoses   Final diagnoses:  Throat irritation    New Prescriptions Discharge Medication List as of 08/04/2017  7:24 PM     I personally performed the services described in this documentation, which was scribed in my presence. The recorded information has been reviewed and is accurate.     [name] 08/04/17 2044    Merrily Pew, MD 08/04/17 [phone]

## 2017-08-04 NOTE — ED Triage Notes (Signed)
Pt sts throat pain; pt sts taking antibiotics for sinuses currently; no distress noted

## 2017-08-04 NOTE — Discharge Instructions (Signed)
As discussed, your throat was clear today and your exam reassuring. Follow up with your primary care provider and ENT as needed. Continue taking your antibiotic as prescribed. Stay well-hydrated. Use tea with honey, cough drops, antihistamine and your nasal spray.  Return to the ER if symptoms worsen, you experience narrowing and your throat, difficulty swallowing, difficulty breathing, fever, chills or other concerning symptoms in the meantime.

## 2017-10-06 ENCOUNTER — Encounter (HOSPITAL_COMMUNITY): Payer: Self-pay

## 2017-10-06 ENCOUNTER — Emergency Department (HOSPITAL_COMMUNITY)
Admission: EM | Admit: 2017-10-06 | Discharge: 2017-10-06 | Disposition: A | Discharge status: Home / Self Care | Payer: Medicaid Other | Attending: Emergency Medicine | Admitting: Emergency Medicine

## 2017-10-06 DIAGNOSIS — Z79899 Other long term (current) drug therapy: Secondary | ICD-10-CM | POA: Insufficient documentation

## 2017-10-06 DIAGNOSIS — R51 Headache: Secondary | ICD-10-CM | POA: Insufficient documentation

## 2017-10-06 DIAGNOSIS — R519 Headache, unspecified: Secondary | ICD-10-CM

## 2017-10-06 DIAGNOSIS — D229 Melanocytic nevi, unspecified: Secondary | ICD-10-CM

## 2017-10-06 DIAGNOSIS — R21 Rash and other nonspecific skin eruption: Secondary | ICD-10-CM | POA: Diagnosis present

## 2017-10-06 DIAGNOSIS — D2239 Melanocytic nevi of other parts of face: Secondary | ICD-10-CM | POA: Insufficient documentation

## 2017-10-06 MED ORDER — ACETAMINOPHEN 325 MG PO TABS
650.0000 mg | ORAL_TABLET | Freq: Once | ORAL | Status: AC
  Administered 2017-10-06: 650 mg via ORAL
  Filled 2017-10-06: qty 2

## 2017-10-06 NOTE — ED Notes (Signed)
Patient unable to sign for discharge at this time; pt verbalizes understanding of discharge instructions. Opportunity for questioning and answers were provided.

## 2017-10-06 NOTE — ED Triage Notes (Signed)
Patient complains of bump under chin x 1 month, denies drainage, NAD

## 2017-10-06 NOTE — ED Provider Notes (Signed)
[facility] EMERGENCY DEPARTMENT Provider Note   CSN: [id number] Arrival date & time: 10/06/17  0827     History   Chief Complaint Chief Complaint  Patient presents with  . bump under chin    HPI Cody [name] is a 26 y.o. male.  Cody [name] is a 26 y.o. Male with a past medical history of asthma, who presents to the ED complaining of a bump under his chin for the past 1-2 months. Denies denies any bumps elsewhere, but reports this one feels different than bumps or pimples he has had in the past. Pt reports some pain with the bump, especially after he has been touching it and messing with it. No drainage. He reports when he messes with it he feels pain in his throat, and he is concerned it is something serious and it will affect his breathing. Pt states he has been having some occipital headaches and thinks they're caused by the bump under his chin, although pain does not radiate from bump to head. These headaches resolve on their own, pt had not been takign any medications at home for these symptoms. Denies associated shortness of breath, fever, chills, neck pain, N/V, or weakness or numbness of extremities.       Past Medical History:  Diagnosis Date  . Asthma    childhood    There are no active problems to display for this patient.   Past Surgical History:  Procedure Laterality Date  . CIRCUMCISION N/A 03/23/2013   Procedure: Circumcision  ;  Surgeon: Alexis Frock, MD;  Location: [facility];  Service: Urology;  Laterality: N/A;  with block       Home Medications    Prior to Admission medications   Medication Sig Start Date End Date Taking? Authorizing Provider  albuterol (PROVENTIL HFA;VENTOLIN HFA) 108 (90 Base) MCG/ACT inhaler Inhale 2 puffs into the lungs every 4 (four) hours as needed for wheezing or shortness of breath. 02/29/16   Lajean Saver, MD  benzonatate (TESSALON) 100 MG capsule Take 1 capsule (100 mg total) by mouth 3  (three) times daily as needed for cough. 11/14/16   Waynetta Pean, PA-C  fluticasone (FLONASE) 50 MCG/ACT nasal spray Place 1 spray into both nostrils daily. 06/27/17   Avie Echevaria B, PA-C  loratadine (CLARITIN) 10 MG tablet Take 1 tablet (10 mg total) by mouth daily. 11/14/16   Waynetta Pean, PA-C  naproxen (NAPROSYN) 250 MG tablet Take 1 tablet (250 mg total) by mouth 2 (two) times daily with a meal. 11/14/16   Waynetta Pean, PA-C  omeprazole (PRILOSEC) 20 MG capsule Take 1 capsule (20 mg total) by mouth daily. 11/14/16   Waynetta Pean, PA-C  triamcinolone (NASACORT AQ) 55 MCG/ACT AERO nasal inhaler Place 2 sprays into the nose daily. 11/14/16   Waynetta Pean, PA-C    Family History No family history on file.  Social History Social History  Substance Use Topics  . Smoking status: Never Smoker  . Smokeless tobacco: Never Used  . Alcohol use No     Allergies   Penicillins and Zyrtec [cetirizine]   Review of Systems Review of Systems   Physical Exam Updated Vital Signs BP 128/75   Pulse 61   Temp 97.8 F (36.6 C) (Oral)   Resp 16   SpO2 100%   Physical Exam  Constitutional: He appears well-developed and well-nourished. No distress.  HENT:  Head: Normocephalic and atraumatic.  TMs clear with good landmarks, posterior oropharynx clear and moist  without edema, erythema or exudates, uvula midline, no peritonsillar edema or abscess, no trismus, no sublingual tenderness   Eyes: Pupils are equal, round, and reactive to light. EOM are normal. Right eye exhibits no discharge. Left eye exhibits no discharge.  Left exotropic strabismus (present since childhood)  Neck: Normal range of motion. Neck supple.    Cardiovascular: Normal rate, regular rhythm and intact distal pulses.   Pulmonary/Chest: Effort normal and breath sounds normal. No stridor. No respiratory distress. He has no wheezes. He has no rales.  Lymphadenopathy:    He has no cervical adenopathy.    Neurological: He is alert. Coordination normal.  Speech is clear, able to follow commands CN III-XII intact Normal strength in upper and lower extremities bilaterally including dorsiflexion and plantar flexion, strong and equal grip strength Sensation normal to light and sharp touch Moves extremities without ataxia, coordination intact  Skin: Skin is warm and dry. He is not diaphoretic.  Psychiatric: He has a normal mood and affect. His behavior is normal.  Nursing note and vitals reviewed.    ED Treatments / Results  Labs (all labs ordered are listed, but only abnormal results are displayed) Labs Reviewed - No data to display  EKG  EKG Interpretation None       Radiology No results found.  Procedures Procedures (including critical care time)  Medications Ordered in ED Medications  acetaminophen (TYLENOL) tablet 650 mg (not administered)     Initial Impression / Assessment and Plan / ED Course  I have reviewed the triage vital signs and the nursing notes.  Pertinent labs & imaging results that were available during my care of the patient were reviewed by me and considered in my medical decision making (see chart for details).  Pt presents with bump under chin, likely a benign mole. Not area of fluctuance or deeper tissue mass felt. Vitals normal and pt well-appearing. Patient is very concerned that bump is something serious, pt reports associated throat pain and headaches. On exam posterior oropharynx is clear w/o erythema or exudates, no concern for PTA or RPA, no stridor, lungs are clear, no neurologic deficits on exam. At this time there does not appear to be any evidence of an acute emergency medical condition and the patient appears stable for discharge with appropriate outpatient follow up with dermatology. Return precautions provided.  Final Clinical Impressions(s) / ED Diagnoses   Final diagnoses:  Skin mole  Generalized headaches    New  Prescriptions Discharge Medication List as of 10/06/2017 11:47 AM       Jacqlyn Larsen, PA-C 10/06/17 2346    Tanna Furry, MD 10/11/17 2246

## 2017-10-06 NOTE — Discharge Instructions (Signed)
Evaluation today is reassuring, bump under chin is likely a mole, there are no signs of swelling or airway compromise, please schedule an appointment to follow up with dermatology regarding this. Please also follow-up with your primary doctor. If you developed significant swelling in that area, shortness of breath fevers or chills please return to the ED.

## 2017-12-05 ENCOUNTER — Encounter (HOSPITAL_COMMUNITY): Payer: Self-pay

## 2017-12-05 ENCOUNTER — Emergency Department (HOSPITAL_COMMUNITY)
Admission: EM | Admit: 2017-12-05 | Discharge: 2017-12-05 | Disposition: A | Discharge status: Home / Self Care | Payer: Medicaid Other | Attending: Emergency Medicine | Admitting: Emergency Medicine

## 2017-12-05 ENCOUNTER — Other Ambulatory Visit: Payer: Self-pay

## 2017-12-05 DIAGNOSIS — J029 Acute pharyngitis, unspecified: Secondary | ICD-10-CM | POA: Diagnosis present

## 2017-12-05 DIAGNOSIS — Z79899 Other long term (current) drug therapy: Secondary | ICD-10-CM | POA: Insufficient documentation

## 2017-12-05 DIAGNOSIS — J020 Streptococcal pharyngitis: Secondary | ICD-10-CM | POA: Diagnosis not present

## 2017-12-05 DIAGNOSIS — J45909 Unspecified asthma, uncomplicated: Secondary | ICD-10-CM | POA: Diagnosis not present

## 2017-12-05 LAB — RAPID STREP SCREEN (MED CTR MEBANE ONLY): Streptococcus, Group A Screen (Direct): POSITIVE — AB

## 2017-12-05 MED ORDER — CLINDAMYCIN HCL 150 MG PO CAPS: 450.0000 mg | ORAL_CAPSULE | Freq: Three times a day (TID) | ORAL | 0 refills | Status: AC

## 2017-12-05 MED ORDER — DEXAMETHASONE 1 MG/ML PO CONC
16.0000 mg | Freq: Once | ORAL | Status: AC
  Administered 2017-12-05: 16 mg via ORAL
  Filled 2017-12-05: qty 16

## 2017-12-05 MED ORDER — DEXAMETHASONE 10 MG/ML FOR PEDIATRIC ORAL USE: 16.0000 mg | Freq: Once | INTRAMUSCULAR | Status: DC

## 2017-12-05 NOTE — ED Notes (Signed)
PA at bedside.

## 2017-12-05 NOTE — Discharge Instructions (Signed)
As discussed, make sure you stay well-hydrated drinking plenty of fluids to keep your urine clear.  Take your entire course of antibiotics even if you feel better. Use tea with honey, cough drops, over-the-counter throat sprays to help alleviate your symptoms. Follow-up with your primary care provider. Return sooner if you experience any worsening or new concerning symptoms in the meantime.

## 2017-12-05 NOTE — ED Notes (Signed)
Patient able to ambulate independently  

## 2017-12-05 NOTE — ED Notes (Signed)
Patient eating graham crackers and peanut butter.  States he does not want to drink anything at this time.  NAD with eating.  Awaiting decadron, pt aware

## 2017-12-05 NOTE — ED Provider Notes (Signed)
[facility] EMERGENCY DEPARTMENT Provider Note   CSN: [id number] Arrival date & time: 12/05/17  1133     History   Chief Complaint Chief Complaint  Patient presents with  . Sore Throat    HPI Cody [name] is a 26 y.o. male but no significant past medical history presenting with a a week of worsening sore throat and odynophagia.  He reports that he has been having intermittent sore throat for a couple months now.  No known ill contacts with children recently diagnosed with strep approximately a week ago and treated.  No fever, chills, nausea, vomiting or other symptoms.  HPI  Past Medical History:  Diagnosis Date  . Asthma    childhood    There are no active problems to display for this patient.   Past Surgical History:  Procedure Laterality Date  . CIRCUMCISION N/A 03/23/2013   Procedure: Circumcision  ;  Surgeon: Alexis Frock, MD;  Location: [facility];  Service: Urology;  Laterality: N/A;  with block       Home Medications    Prior to Admission medications   Medication Sig Start Date End Date Taking? Authorizing Provider  albuterol (PROVENTIL HFA;VENTOLIN HFA) 108 (90 Base) MCG/ACT inhaler Inhale 2 puffs into the lungs every 4 (four) hours as needed for wheezing or shortness of breath. 02/29/16   Lajean Saver, MD  benzonatate (TESSALON) 100 MG capsule Take 1 capsule (100 mg total) by mouth 3 (three) times daily as needed for cough. 11/14/16   Waynetta Pean, PA-C  clindamycin (CLEOCIN) 150 MG capsule Take 3 capsules (450 mg total) by mouth 3 (three) times daily for 10 days. 12/05/17 12/15/17  Avie Echevaria B, PA-C  fluticasone (FLONASE) 50 MCG/ACT nasal spray Place 1 spray into both nostrils daily. 06/27/17   Avie Echevaria B, PA-C  loratadine (CLARITIN) 10 MG tablet Take 1 tablet (10 mg total) by mouth daily. 11/14/16   Waynetta Pean, PA-C  naproxen (NAPROSYN) 250 MG tablet Take 1 tablet (250 mg total) by mouth 2 (two)  times daily with a meal. 11/14/16   Waynetta Pean, PA-C  omeprazole (PRILOSEC) 20 MG capsule Take 1 capsule (20 mg total) by mouth daily. 11/14/16   Waynetta Pean, PA-C  triamcinolone (NASACORT AQ) 55 MCG/ACT AERO nasal inhaler Place 2 sprays into the nose daily. 11/14/16   Waynetta Pean, PA-C    Family History No family history on file.  Social History Social History   Tobacco Use  . Smoking status: Never Smoker  . Smokeless tobacco: Never Used  Substance Use Topics  . Alcohol use: No  . Drug use: No     Allergies   Penicillins and Zyrtec [cetirizine]   Review of Systems Review of Systems  Constitutional: Negative for chills and fever.  HENT: Positive for sore throat. Negative for congestion, drooling, sinus pain, trouble swallowing and voice change.   Respiratory: Negative for cough, choking, chest tightness, shortness of breath, wheezing and stridor.   Cardiovascular: Negative for chest pain and palpitations.  Gastrointestinal: Negative for abdominal pain, nausea and vomiting.  Musculoskeletal: Negative for arthralgias, joint swelling, myalgias and neck stiffness.  Neurological: Negative for dizziness, light-headedness and headaches.     Physical Exam Updated Vital Signs BP 117/73 (BP Location: Right Arm)   Pulse 76   Temp 98.4 F (36.9 C) (Oral)   Resp 16   Ht 5\' 11"  (1.803 m)   Wt 98 kg (216 lb)   SpO2 100%   BMI 30.13 kg/m  Physical Exam  Constitutional: He appears well-developed and well-nourished.  Non-toxic appearance. He does not appear ill. No distress.  Well-appearing, nontoxic afebrile sitting comfortably on the bed in no acute distress.  HENT:  Head: Normocephalic and atraumatic.  Mouth/Throat: Uvula is midline and mucous membranes are normal. Mucous membranes are not pale. No oral lesions. No uvula swelling. Posterior oropharyngeal erythema present. No oropharyngeal exudate, posterior oropharyngeal edema or tonsillar abscesses.  No gross  oral abscess. Uvula is midline, arches are simmetrical and intact. No peritonsilar swelling or exudate. No trismus. Sublingual mucosa is soft and non-tender. Tolerating oral secretions. No concern for ludwig's angina.   Eyes: Conjunctivae and EOM are normal.  Neck: Normal range of motion. Neck supple.  Cardiovascular: Normal rate and regular rhythm.  No murmur heard. Pulmonary/Chest: Effort normal and breath sounds normal. No stridor. No respiratory distress. He has no wheezes. He has no rhonchi. He has no rales.  Abdominal: He exhibits no distension.  Musculoskeletal: He exhibits no edema.  Lymphadenopathy:    He has cervical adenopathy.  Neurological: He is alert.  Skin: Skin is warm and dry. No rash noted. No erythema. No pallor.  Psychiatric: He has a normal mood and affect.  Nursing note and vitals reviewed.    ED Treatments / Results  Labs (all labs ordered are listed, but only abnormal results are displayed) Labs Reviewed  RAPID STREP SCREEN (NOT AT [facility]) - Abnormal; Notable for the following components:      Result Value   Streptococcus, Group A Screen (Direct) POSITIVE (*)    All other components within normal limits    EKG  EKG Interpretation None       Radiology No results found.  Procedures Procedures (including critical care time)  Medications Ordered in ED Medications  dexamethasone (DECADRON) 1 MG/ML solution 16 mg (not administered)     Initial Impression / Assessment and Plan / ED Course  I have reviewed the triage vital signs and the nursing notes.  Pertinent labs & imaging results that were available during my care of the patient were reviewed by me and considered in my medical decision making (see chart for details).    Pt afebrile without tonsillar exudate. Mild cervical lymphadenopathy, & odynophagia; diagnosis of strep. Treated in the ED with steroids.  Pt does not appear dehydrated, but discussed importance of water rehydration.    Presentation non-concerning for PTA or infxn spread to soft tissue. No trismus or uvula deviation. Specific return precautions discussed. Pt able to drink water in ED without difficulty with intact airway. Recommended PCP follow up.  Patient allergic to penicillin will treat with clindamycin.  Discussed strict return precautions and advised to return to the emergency department if experiencing any new or worsening symptoms. Instructions were understood and patient agreed with discharge plan.  Final Clinical Impressions(s) / ED Diagnoses   Final diagnoses:  Strep pharyngitis    ED Discharge Orders        Ordered    clindamycin (CLEOCIN) 150 MG capsule  3 times daily     12/05/17 1333       [name] 12/05/17 1414    Dorie Rank, MD 12/06/17 1235

## 2017-12-05 NOTE — ED Triage Notes (Signed)
Patient complains of intermittent sore throat x several weeks, chills with same, NAD

## 2018-01-13 ENCOUNTER — Encounter (HOSPITAL_COMMUNITY): Payer: Self-pay | Admitting: Emergency Medicine

## 2018-01-13 ENCOUNTER — Emergency Department (HOSPITAL_COMMUNITY)
Admission: EM | Admit: 2018-01-13 | Discharge: 2018-01-13 | Disposition: A | Discharge status: Home / Self Care | Payer: Medicaid Other | Attending: Emergency Medicine | Admitting: Emergency Medicine

## 2018-01-13 DIAGNOSIS — K0889 Other specified disorders of teeth and supporting structures: Secondary | ICD-10-CM | POA: Diagnosis present

## 2018-01-13 DIAGNOSIS — Z880 Allergy status to penicillin: Secondary | ICD-10-CM | POA: Insufficient documentation

## 2018-01-13 DIAGNOSIS — J45909 Unspecified asthma, uncomplicated: Secondary | ICD-10-CM | POA: Diagnosis not present

## 2018-01-13 DIAGNOSIS — Z79899 Other long term (current) drug therapy: Secondary | ICD-10-CM | POA: Diagnosis not present

## 2018-01-13 MED ORDER — CLINDAMYCIN HCL 150 MG PO CAPS: 450.0000 mg | ORAL_CAPSULE | Freq: Three times a day (TID) | ORAL | 0 refills | Status: AC

## 2018-01-13 MED ORDER — ACETAMINOPHEN 500 MG PO TABS
1000.0000 mg | ORAL_TABLET | Freq: Once | ORAL | Status: AC
  Administered 2018-01-13: 1000 mg via ORAL
  Filled 2018-01-13: qty 2

## 2018-01-13 NOTE — Discharge Instructions (Signed)
Take antibiotics as directed. Please take all of your antibiotics until finished.  You can take Tylenol or Ibuprofen as directed for pain. You can alternate Tylenol and Ibuprofen every 4 hours. If you take Tylenol at 1pm, then you can take Ibuprofen at 5pm. Then you can take Tylenol again at 9pm.   The exam and treatment you received today has been provided on an emergency basis only. This is not a substitute for complete medical or dental care. If your problem worsens or new symptoms (problems) appear, and you are unable to arrange prompt follow-up care with your dentist, call or return to this location. If you do not have a dentist, please follow-up with one on the list provided  CALL YOUR DENTIST OR RETURN IMMEDIATELY IF you develop a fever, rash, difficulty breathing or swallowing, neck or facial swelling, or other potentially serious concerns.   Please follow-up with one of the dental clinics provided to you below or in your paperwork. Call and tell them you were seen in the Emergency Dept and arrange for an appointment. You may have to call multiple places in order to find a place to be seen.  Dental Assistance If the dentist on-call cannot see you, please use the resources below:   Patients with Medicaid: [facility] [address], [phone], [phone]  If unable to pay, or uninsured, contact [facility] ([phone]) or [facility] ([phone] in [place], [phone] in [place]) to become qualified for the adult dental clinic  Other Low-Cost Community Dental Services: [facility]- [address], [place], [place], [address]    [phone], Ext. [phone]    2nd and 4th Thursday of the month at 6:30am    10 clients each day by appointment, can sometimes see walk-in     patients if someone does not show for an appointment [facility]- [address], [place], [place], [address]    [phone]  [facility]- [address], [place], [place], [address]    [phone]  [facility]- [phone] [facility]- [phone] [facility]- [phone]  

## 2018-01-13 NOTE — ED Provider Notes (Signed)
[facility] EMERGENCY DEPARTMENT Provider Note   CSN: [id number] Arrival date & time: 01/13/18  1055     History   Chief Complaint Chief Complaint  Patient presents with  . Dental Pain    HPI Cody [name] is a 27 y.o. male presents for evaluation of left lower dental pain that is been ongoing for the last several months.  Patient states that it is worsened over the last few days.  He states that he has been taking Tylenol sporadically for pain.  He has not noticed any facial swelling, redness, warmth.  He does not follow-up with a dentist.  States he is able to tolerate his secretions and p.o. but does report pain is worsened with attempting to chew.  Patient denies any fever, difficulty breathing, difficulty swallowing.  The history is provided by the patient.    Past Medical History:  Diagnosis Date  . Asthma    childhood    There are no active problems to display for this patient.   Past Surgical History:  Procedure Laterality Date  . CIRCUMCISION N/A 03/23/2013   Procedure: Circumcision  ;  Surgeon: Alexis Frock, MD;  Location: [facility];  Service: Urology;  Laterality: N/A;  with block       Home Medications    Prior to Admission medications   Medication Sig Start Date End Date Taking? Authorizing Provider  acetaminophen (TYLENOL) 500 MG tablet Take 500 mg by mouth every 6 (six) hours as needed for mild pain.   Yes [provider]  albuterol (PROVENTIL HFA;VENTOLIN HFA) 108 (90 Base) MCG/ACT inhaler Inhale 2 puffs into the lungs every 4 (four) hours as needed for wheezing or shortness of breath. 02/29/16  Yes Lajean Saver, MD  benzonatate (TESSALON) 100 MG capsule Take 1 capsule (100 mg total) by mouth 3 (three) times daily as needed for cough. Patient not taking: Reported on 01/13/2018 11/14/16   Waynetta Pean, PA-C  clindamycin (CLEOCIN) 150 MG capsule Take 3 capsules (450 mg total) by mouth 3 (three) times daily for  7 days. 01/13/18 01/20/18  Volanda Napoleon, PA-C  fluticasone (FLONASE) 50 MCG/ACT nasal spray Place 1 spray into both nostrils daily. Patient not taking: Reported on 01/13/2018 06/27/17   Avie Echevaria B, PA-C  loratadine (CLARITIN) 10 MG tablet Take 1 tablet (10 mg total) by mouth daily. Patient not taking: Reported on 01/13/2018 11/14/16   Waynetta Pean, PA-C  naproxen (NAPROSYN) 250 MG tablet Take 1 tablet (250 mg total) by mouth 2 (two) times daily with a meal. Patient not taking: Reported on 01/13/2018 11/14/16   Waynetta Pean, PA-C  omeprazole (PRILOSEC) 20 MG capsule Take 1 capsule (20 mg total) by mouth daily. Patient not taking: Reported on 01/13/2018 11/14/16   Waynetta Pean, PA-C  triamcinolone (NASACORT AQ) 55 MCG/ACT AERO nasal inhaler Place 2 sprays into the nose daily. Patient not taking: Reported on 01/13/2018 11/14/16   Waynetta Pean, PA-C    Family History History reviewed. No pertinent family history.  Social History Social History   Tobacco Use  . Smoking status: Never Smoker  . Smokeless tobacco: Never Used  Substance Use Topics  . Alcohol use: No  . Drug use: No     Allergies   Penicillins and Zyrtec [cetirizine]   Review of Systems Review of Systems  HENT: Positive for dental problem. Negative for facial swelling, sore throat and trouble swallowing.      Physical Exam Updated Vital Signs BP 130/74  Pulse 62   Temp 97.8 F (36.6 C) (Oral)   Resp 16   SpO2 99%   Physical Exam  Constitutional: He appears well-developed and well-nourished.  HENT:  Head: Normocephalic and atraumatic.  Mouth/Throat: Uvula is midline, oropharynx is clear and moist and mucous membranes are normal.    Uvula is midline.  No trismus.  No evidence of facial or neck swelling.  No evidence of peritonsillar abscess.  Eyes: Conjunctivae and EOM are normal. Right eye exhibits no discharge. Left eye exhibits no discharge. No scleral icterus.  Pulmonary/Chest: Effort  normal.  Neurological: He is alert.  Skin: Skin is warm and dry.  Psychiatric: He has a normal mood and affect. His speech is normal and behavior is normal.  Nursing note and vitals reviewed.    ED Treatments / Results  Labs (all labs ordered are listed, but only abnormal results are displayed) Labs Reviewed - No data to display  EKG  EKG Interpretation None       Radiology No results found.  Procedures Procedures (including critical care time)  Medications Ordered in ED Medications  acetaminophen (TYLENOL) tablet 1,000 mg (1,000 mg Oral Given 01/13/18 1247)     Initial Impression / Assessment and Plan / ED Course  I have reviewed the triage vital signs and the nursing notes.  Pertinent labs & imaging results that were available during my care of the patient were reviewed by me and considered in my medical decision making (see chart for details).     27 y.o.  M presents with dental pain that is been ongoing for the last several weeks.  No fevers, facial swelling.  Patient does not have a dentist to follow-up with. Patient is afebrile , non-toxic appearing, sitting comfortably on examination table. Vital signs reviewed and stable.  Patient is slightly hypertensive.,  Likely secondary to pain.  Will treat pain and reassess.  On physical exam, patient does have a partially cracked tooth.  I suspect that this is the source of his pain.  I do not see any surrounding gingival erythema or swelling.  There is no identifiable dental abscess that is amenable to drainage at this time. Exam not concerning for Ludwig's angina or pharyngeal abscess. Will treat with clindamycin as patient is allergic to penicillin. Patient instructed to follow-up with dentist referral provided.  Repeat vitals show blood pressure has improved.  Stable for discharge at this time. Patient had ample opportunity for questions and discussion. All patient's questions were answered with full understanding. Strict  return precautions discussed. Patient expresses understanding and agreement to plan.  Final Clinical Impressions(s) / ED Diagnoses   Final diagnoses:  Pain, dental    ED Discharge Orders        Ordered    clindamycin (CLEOCIN) 150 MG capsule  3 times daily     01/13/18 1223       Desma Mcgregor 01/13/18 1255    Drenda Freeze, MD 01/13/18 Lurline Hare

## 2018-01-13 NOTE — ED Triage Notes (Signed)
Pt to ER for left lower dental pain onset x "weeks." states sensitivity with eating.

## 2018-04-05 ENCOUNTER — Other Ambulatory Visit: Payer: Self-pay

## 2018-04-05 ENCOUNTER — Encounter (HOSPITAL_COMMUNITY): Payer: Self-pay | Admitting: Emergency Medicine

## 2018-04-05 ENCOUNTER — Emergency Department (HOSPITAL_COMMUNITY)
Admission: EM | Admit: 2018-04-05 | Discharge: 2018-04-05 | Disposition: A | Discharge status: Home / Self Care | Payer: Medicaid Other | Attending: Emergency Medicine | Admitting: Emergency Medicine

## 2018-04-05 DIAGNOSIS — J45909 Unspecified asthma, uncomplicated: Secondary | ICD-10-CM | POA: Insufficient documentation

## 2018-04-05 DIAGNOSIS — R110 Nausea: Secondary | ICD-10-CM | POA: Diagnosis present

## 2018-04-05 DIAGNOSIS — R0981 Nasal congestion: Secondary | ICD-10-CM | POA: Diagnosis not present

## 2018-04-05 LAB — COMPREHENSIVE METABOLIC PANEL
ALBUMIN: 4.2 g/dL (ref 3.5–5.0)
ALK PHOS: 69 U/L (ref 38–126)
ALT: 28 U/L (ref 17–63)
ANION GAP: 9 (ref 5–15)
AST: 24 U/L (ref 15–41)
BUN: 11 mg/dL (ref 6–20)
CALCIUM: 9.3 mg/dL (ref 8.9–10.3)
CO2: 26 mmol/L (ref 22–32)
Chloride: 103 mmol/L (ref 101–111)
Creatinine, Ser: 1.38 mg/dL — ABNORMAL HIGH (ref 0.61–1.24)
GFR calc Af Amer: >60 mL/min (ref >60)
GLUCOSE: 92 mg/dL (ref 65–99)
Potassium: 3.9 mmol/L (ref 3.5–5.1)
Sodium: 138 mmol/L (ref 135–145)
Total Bilirubin: 0.8 mg/dL (ref 0.3–1.2)
Total Protein: 7.4 g/dL (ref 6.5–8.1)

## 2018-04-05 LAB — CBC
HCT: 46.5 % (ref 39.0–52.0)
Hemoglobin: 15.8 g/dL (ref 13.0–17.0)
MCH: 31.3 pg (ref 26.0–34.0)
MCHC: 34 g/dL (ref 30.0–36.0)
MCV: 92.1 fL (ref 78.0–100.0)
Platelets: 317 10*3/uL (ref 150–400)
RBC: 5.05 MIL/uL (ref 4.22–5.81)
RDW: 13.3 % (ref 11.5–15.5)
WBC: 5 10*3/uL (ref 4.0–10.5)

## 2018-04-05 LAB — LIPASE, BLOOD: Lipase: 22 U/L (ref 11–51)

## 2018-04-05 MED ORDER — FLUTICASONE PROPIONATE 50 MCG/ACT NA SUSP: 1.0000 | Freq: Every day | NASAL | 0 refills | Status: DC

## 2018-04-05 MED ORDER — LORATADINE 10 MG PO TABS: 10.0000 mg | ORAL_TABLET | Freq: Every day | ORAL | 0 refills | Status: DC

## 2018-04-05 NOTE — ED Triage Notes (Signed)
Pt states since he woke up this morning he has felt nausea and had a headache. Pt also reports he has been waking up and feeling like his arms are asleep.

## 2018-04-05 NOTE — Discharge Instructions (Addendum)
Please read attached information. If you experience any new or worsening signs or symptoms please return to the emergency room for evaluation. Please follow-up with your primary care provider or specialist as discussed. Please use medication prescribed only as directed and discontinue taking if you have any concerning signs or symptoms.   °

## 2018-04-05 NOTE — ED Provider Notes (Signed)
[place] EMERGENCY DEPARTMENT Provider Note   CSN: [id number] Arrival date & time: 04/05/18  1546     History   Chief Complaint Chief Complaint  Patient presents with  . Headache  . Nausea    HPI Cody [name] is a 27 y.o. male.  HPI   27 year old male presents today with complaints of nausea.  Patient notes that yesterday he was having right-sided ear pain when listening to music.  He notes his if he has fluid in his right ear.  Patient also notes nasal congestion.  He reports waking up this morning feeling nausea with slight dizziness, he reports the dizziness has gone away, but continues to describe nausea when up walking and moving.  He notes symptoms are improved with rest.  He denies any vomiting, neurological deficits, neck pain or stiffness, fever chills, denies headache here today.  Patient reports that he does not drink adequate amounts of water.    Past Medical History:  Diagnosis Date  . Asthma    childhood    There are no active problems to display for this patient.   Past Surgical History:  Procedure Laterality Date  . CIRCUMCISION N/A 03/23/2013   Procedure: Circumcision  ;  Surgeon: Alexis Frock, MD;  Location: [facility]  Service: Urology;  Laterality: N/A;  with block        Home Medications    Prior to Admission medications   Medication Sig Start Date End Date Taking? Authorizing Provider  acetaminophen (TYLENOL) 500 MG tablet Take 500 mg by mouth every 6 (six) hours as needed for mild pain.    [provider]  albuterol (PROVENTIL HFA;VENTOLIN HFA) 108 (90 Base) MCG/ACT inhaler Inhale 2 puffs into the lungs every 4 (four) hours as needed for wheezing or shortness of breath. 02/29/16   Lajean Saver, MD  benzonatate (TESSALON) 100 MG capsule Take 1 capsule (100 mg total) by mouth 3 (three) times daily as needed for cough. Patient not taking: Reported on 01/13/2018 11/14/16   Waynetta Pean, PA-C    fluticasone [facility]) 50 MCG/ACT nasal spray Place 1 spray into both nostrils daily. 04/05/18   Numa Heatwole, Dellis Filbert, PA-C  loratadine (CLARITIN) 10 MG tablet Take 1 tablet (10 mg total) by mouth daily. 04/05/18   Allanna Bresee, Dellis Filbert, PA-C  naproxen (NAPROSYN) 250 MG tablet Take 1 tablet (250 mg total) by mouth 2 (two) times daily with a meal. Patient not taking: Reported on 01/13/2018 11/14/16   Waynetta Pean, PA-C  omeprazole (PRILOSEC) 20 MG capsule Take 1 capsule (20 mg total) by mouth daily. Patient not taking: Reported on 01/13/2018 11/14/16   Waynetta Pean, PA-C  triamcinolone (NASACORT AQ) 55 MCG/ACT AERO nasal inhaler Place 2 sprays into the nose daily. Patient not taking: Reported on 01/13/2018 11/14/16   Waynetta Pean, PA-C    Family History No family history on file.  Social History Social History   Tobacco Use  . Smoking status: Never Smoker  . Smokeless tobacco: Never Used  Substance Use Topics  . Alcohol use: No  . Drug use: No     Allergies   Penicillins and Zyrtec [cetirizine]   Review of Systems Review of Systems  All other systems reviewed and are negative.   Physical Exam Updated Vital Signs BP 123/74 (BP Location: Right Arm)   Pulse 61   Temp 98.2 F (36.8 C) (Oral)   Resp 16   SpO2 98%   Physical Exam  Constitutional: He is oriented to person, place,  and time. He appears well-developed and well-nourished.  HENT:  Head: Normocephalic and atraumatic.  Right Ear: Hearing, tympanic membrane, external ear and ear canal normal.  Left Ear: Hearing, tympanic membrane, external ear and ear canal normal.  Eyes: Pupils are equal, round, and reactive to light. Conjunctivae are normal. Right eye exhibits no discharge. Left eye exhibits no discharge. No scleral icterus.  Neck: Normal range of motion. No JVD present. No tracheal deviation present.  Cardiovascular: Normal rate, regular rhythm, normal heart sounds and intact distal pulses. Exam reveals no gallop and  no friction rub.  No murmur heard. Pulmonary/Chest: Effort normal. No stridor.  Neurological: He is alert and oriented to person, place, and time. No cranial nerve deficit or sensory deficit. Coordination normal. GCS eye subscore is 4. GCS verbal subscore is 5. GCS motor subscore is 6.  Psychiatric: He has a normal mood and affect. His behavior is normal. Judgment and thought content normal.  Nursing note and vitals reviewed.   ED Treatments / Results  Labs (all labs ordered are listed, but only abnormal results are displayed) Labs Reviewed  COMPREHENSIVE METABOLIC PANEL - Abnormal; Notable for the following components:      Result Value   Creatinine, Ser 1.38 (*)    All other components within normal limits  LIPASE, BLOOD  CBC    EKG None  Radiology No results found.  Procedures Procedures (including critical care time)  Medications Ordered in ED Medications - No data to display   Initial Impression / Assessment and Plan / ED Course  I have reviewed the triage vital signs and the nursing notes.  Pertinent labs & imaging results that were available during my care of the patient were reviewed by me and considered in my medical decision making (see chart for details).      Final Clinical Impressions(s) / ED Diagnoses   Final diagnoses:  Nausea  Nasal congestion    Labs: Lipase, CMP, CBC  Imaging:  Consults:  Therapeutics:  Discharge Meds: Claritin, Flonase  Assessment/Plan: 27 year old male presents today with complaints of nausea.  He is well-appearing in no acute distress.  Patient reports some dizziness this morning, this was not associated with headache, he has no neurological deficits and is highly unlikely to be central cause.  Patient having right-sided ear pain question sinus congestion.  Patient denied any chest pain shortness of breath he would indicate cardiac cause.  Patient slightly dehydrated here.  He will be treated with Flonase, Claritin,  outpatient follow-up with primary care in 2 days if symptoms persist and return to emergency room if they worsen.  Patient verbalized understanding and agreement to today's plan had no further questions or concerns at time of discharge.   ED Discharge Orders        Ordered    fluticasone (FLONASE) 50 MCG/ACT nasal spray  Daily     04/05/18 1922    loratadine (CLARITIN) 10 MG tablet  Daily     04/05/18 1922       Francee Gentile 04/05/18 2155    Charlesetta Shanks, MD 04/06/18 [phone]

## 2018-06-30 ENCOUNTER — Emergency Department (HOSPITAL_COMMUNITY)
Admission: EM | Admit: 2018-06-30 | Discharge: 2018-06-30 | Disposition: A | Discharge status: Home / Self Care | Payer: Medicaid Other | Attending: Emergency Medicine | Admitting: Emergency Medicine

## 2018-06-30 ENCOUNTER — Encounter (HOSPITAL_COMMUNITY): Payer: Self-pay | Admitting: Emergency Medicine

## 2018-06-30 DIAGNOSIS — R0981 Nasal congestion: Secondary | ICD-10-CM | POA: Diagnosis present

## 2018-06-30 DIAGNOSIS — J45909 Unspecified asthma, uncomplicated: Secondary | ICD-10-CM | POA: Diagnosis not present

## 2018-06-30 DIAGNOSIS — J329 Chronic sinusitis, unspecified: Secondary | ICD-10-CM | POA: Diagnosis not present

## 2018-06-30 DIAGNOSIS — R238 Other skin changes: Secondary | ICD-10-CM | POA: Diagnosis not present

## 2018-06-30 MED ORDER — DOXYCYCLINE HYCLATE 100 MG PO CAPS: 100.0000 mg | ORAL_CAPSULE | Freq: Two times a day (BID) | ORAL | 0 refills | Status: AC

## 2018-06-30 MED ORDER — LORATADINE 10 MG PO TABS: 10.0000 mg | ORAL_TABLET | Freq: Every day | ORAL | 0 refills | Status: DC

## 2018-06-30 MED ORDER — EUCERIN EX CREA: TOPICAL_CREAM | CUTANEOUS | 0 refills | Status: DC | PRN

## 2018-06-30 MED ORDER — FLUTICASONE PROPIONATE 50 MCG/ACT NA SUSP: 2.0000 | Freq: Every day | NASAL | 0 refills | Status: DC

## 2018-06-30 NOTE — ED Triage Notes (Signed)
Pt presents to ED for assessment of hair bumps on the skin of his neck from shaving.  Patient states the pain is so bad it gives him a headache.  Patient also c/o sinus congestion, post-nasal drip, and cough.

## 2018-06-30 NOTE — ED Provider Notes (Signed)
[place] EMERGENCY DEPARTMENT Provider Note   CSN: [id number] Arrival date & time: 06/30/18  1109     History   Chief Complaint Chief Complaint  Patient presents with  . Rash    HPI Cody [name] is a 27 y.o. male.  HPI   Pt is a 27 y/o male with a h/o asthma who presents to the ED today c/o a rash below his chin and on the back of his head. HE states he has small areas of skin irritation that occurred after he had his facial hair trimmed at the barber shop. States he has a burning pain and it causes him to have a headache. No current headache. No fevers. No recent tick bites. No numbness/weakness, vision changes. Has tried antibiotic ointment and selsun blue without relief.   He also reports rhinorrhea, nasal congestion, postnasal drip, sinus pressure, sinus pain. that has been present for a few weeks. Also reports allergies. He has taken claritin which improves his allergy sxs.  Past Medical History:  Diagnosis Date  . Asthma    childhood    There are no active problems to display for this patient.   Past Surgical History:  Procedure Laterality Date  . CIRCUMCISION N/A 03/23/2013   Procedure: Circumcision  ;  Surgeon: Alexis Frock, MD;  Location: [facility];  Service: Urology;  Laterality: N/A;  with block       Home Medications    Prior to Admission medications   Medication Sig Start Date End Date Taking? Authorizing Provider  acetaminophen (TYLENOL) 500 MG tablet Take 500 mg by mouth every 6 (six) hours as needed for mild pain.    [provider]  albuterol (PROVENTIL HFA;VENTOLIN HFA) 108 (90 Base) MCG/ACT inhaler Inhale 2 puffs into the lungs every 4 (four) hours as needed for wheezing or shortness of breath. 02/29/16   Lajean Saver, MD  benzonatate (TESSALON) 100 MG capsule Take 1 capsule (100 mg total) by mouth 3 (three) times daily as needed for cough. Patient not taking: Reported on 01/13/2018 11/14/16   Waynetta Pean, PA-C  doxycycline (VIBRAMYCIN) 100 MG capsule Take 1 capsule (100 mg total) by mouth 2 (two) times daily for 7 days. 06/30/18 07/07/18  Ayasha Ellingsen S, PA-C  fluticasone (FLONASE) 50 MCG/ACT nasal spray Place 2 sprays into both nostrils daily. 06/30/18   Shelly Shoultz S, PA-C  loratadine (CLARITIN) 10 MG tablet Take 1 tablet (10 mg total) by mouth daily. 06/30/18   Treasure Ingrum S, PA-C  naproxen (NAPROSYN) 250 MG tablet Take 1 tablet (250 mg total) by mouth 2 (two) times daily with a meal. Patient not taking: Reported on 01/13/2018 11/14/16   Waynetta Pean, PA-C  omeprazole (PRILOSEC) 20 MG capsule Take 1 capsule (20 mg total) by mouth daily. Patient not taking: Reported on 01/13/2018 11/14/16   Waynetta Pean, PA-C  Skin Protectants, Misc. (EUCERIN) cream Apply topically as needed for dry skin. Apply to affected areas 06/30/18   Tank Difiore S, PA-C  triamcinolone (NASACORT AQ) 55 MCG/ACT AERO nasal inhaler Place 2 sprays into the nose daily. Patient not taking: Reported on 01/13/2018 11/14/16   Waynetta Pean, PA-C    Family History History reviewed. No pertinent family history.  Social History Social History   Tobacco Use  . Smoking status: Never Smoker  . Smokeless tobacco: Never Used  Substance Use Topics  . Alcohol use: No  . Drug use: No     Allergies   Penicillins and Zyrtec [  cetirizine]  Review of Systems Review of Systems  Constitutional: Negative for fever.  HENT: Positive for congestion, postnasal drip and rhinorrhea. Negative for sore throat.   Respiratory: Negative for cough and shortness of breath.   Cardiovascular: Negative for chest pain.  Gastrointestinal: Negative for constipation, diarrhea, nausea and vomiting.  Genitourinary: Negative for dysuria, flank pain, frequency, hematuria and urgency.  Skin: Positive for rash and wound.  Neurological: Positive for headaches. Negative for dizziness, weakness, light-headedness and numbness.      Physical Exam Updated Vital Signs BP 116/80 (BP Location: Right Arm)   Pulse 76   Temp 98.1 F (36.7 C) (Oral)   Resp 20   SpO2 100%   Physical Exam  Constitutional: He is oriented to person, place, and time. He appears well-developed and well-nourished.  HENT:  Head: Normocephalic and atraumatic.  bilat TMs without erythema. Left TM clear effusion.  No erythema.  No tonsillar swelling or exudate.  Bilateral nasal turbinates are swollen and boggy. TTP to bilat frontal sinuses  Eyes: Pupils are equal, round, and reactive to light. Conjunctivae and EOM are normal.  Neck: Neck supple.  Cardiovascular: Normal rate, regular rhythm, normal heart sounds and intact distal pulses.  No murmur heard. Pulmonary/Chest: Effort normal and breath sounds normal. No stridor. No respiratory distress. He has no wheezes.  Abdominal: Soft. Bowel sounds are normal. He exhibits no distension. There is no tenderness. There is no guarding.  Musculoskeletal: He exhibits no edema.  Lymphadenopathy:    He has no cervical adenopathy.  Neurological: He is alert and oriented to person, place, and time. No cranial nerve deficit.  Skin: Skin is warm and dry. Capillary refill takes less than 2 seconds.  2-3 small areas where the skin has an abrasion underneath his chin.  One small area of abrasion to the left scalp.  No surrounding cellulitis or evidence of infection.  Psychiatric: He has a normal mood and affect.  Nursing note and vitals reviewed.    ED Treatments / Results  Labs (all labs ordered are listed, but only abnormal results are displayed) Labs Reviewed - No data to display  EKG None  Radiology No results found.  Procedures Procedures (including critical care time)  Medications Ordered in ED Medications - No data to display   Initial Impression / Assessment and Plan / ED Course  I have reviewed the triage vital signs and the nursing notes.  Pertinent labs & imaging results that  were available during my care of the patient were reviewed by me and considered in my medical decision making (see chart for details).   Final Clinical Impressions(s) / ED Diagnoses   Final diagnoses:  Rhinosinusitis  Skin irritation   Patient complaining of irritation to the skin.  No rash noted on exam.  He has some areas of skin excoriation which look like abrasions from shaving or picking at his skin.  No evidence of superinfection.  Advised skin hydration, using triple antibiotic at night, using shaving cream and shaving, or avoiding shaving altogether.  Also complaining of rhinorrhea, nasal congestion and sinus pressure/pain for several weeks.  Will give Rx for a possible bacterial rhinosinusitis as well as symptomatic treatment with Flonase, and Claritin to prevent allergies.  Advised to follow-up with PCP and return if worse.  All questions answered patient understands plan agrees to return immediately to ED.  ED Discharge Orders        Ordered    doxycycline (VIBRAMYCIN) 100 MG capsule  2 times daily  06/30/18 1247    fluticasone (FLONASE) 50 MCG/ACT nasal spray  Daily     06/30/18 1247    loratadine (CLARITIN) 10 MG tablet  Daily     06/30/18 1247    Skin Protectants, Misc. (EUCERIN) cream  As needed     06/30/18 1247       Vuong Musa, [employer], PA-C 06/30/18 1249    Milton Ferguson, MD 06/30/18 1528

## 2018-06-30 NOTE — Discharge Instructions (Signed)
Please take the antibiotic to help with your nasal symptoms.  You are also given Flonase and Claritin to take for your symptoms.  Use the eucerin cream on the affected areas of your skin.   Please follow up with your primary care provider within 5-7 days for re-evaluation of your symptoms. If you do not have a primary care provider, information for a healthcare clinic has been provided for you to make arrangements for follow up care. Please return to the emergency department for any new or worsening symptoms.

## 2018-06-30 NOTE — ED Notes (Signed)
Patient able to ambulate independently  

## 2018-10-06 ENCOUNTER — Encounter (HOSPITAL_COMMUNITY): Payer: Self-pay | Admitting: *Deleted

## 2018-10-06 ENCOUNTER — Other Ambulatory Visit: Payer: Self-pay

## 2018-10-06 ENCOUNTER — Emergency Department (HOSPITAL_COMMUNITY)
Admission: EM | Admit: 2018-10-06 | Discharge: 2018-10-06 | Disposition: A | Discharge status: Home / Self Care | Payer: Medicaid Other | Attending: Emergency Medicine | Admitting: Emergency Medicine

## 2018-10-06 DIAGNOSIS — Z79899 Other long term (current) drug therapy: Secondary | ICD-10-CM | POA: Insufficient documentation

## 2018-10-06 DIAGNOSIS — L299 Pruritus, unspecified: Secondary | ICD-10-CM

## 2018-10-06 MED ORDER — TRIAMCINOLONE ACETONIDE 0.1 % EX CREA: 1.0000 "application " | TOPICAL_CREAM | Freq: Two times a day (BID) | CUTANEOUS | 0 refills | Status: AC

## 2018-10-06 NOTE — ED Provider Notes (Signed)
[facility] EMERGENCY DEPARTMENT Provider Note   CSN: [id number] Arrival date & time: 10/06/18  0945     History   Chief Complaint Chief Complaint  Patient presents with  . Chemical Exposure    HPI Cody [name] is a 27 y.o. male.  HPI   Cody [name] is a 27 y.o. male, with a history of childhood asthma, presenting to the ED with itching to the back of the neck and his scalp for the last couple weeks.  States he was exposed to mold due to leaking air conditioner in his home.  His symptoms began after discovering the mold. He has been taking Claritin, but has not tried any other treatments.  He also complains of headaches intermittently for the last couple weeks.  States he has had these types of headaches before.  They are mostly in the back of the head and extend into his neck muscles.  States he has been quite stressed regarding the mold issue.  Denies fever/chills, nausea/vomiting, shortness of breath, chest pain, abdominal pain, cough, acute congestion, or any other complaints.   Past Medical History:  Diagnosis Date  . Asthma    childhood    There are no active problems to display for this patient.   Past Surgical History:  Procedure Laterality Date  . CIRCUMCISION N/A 03/23/2013   Procedure: Circumcision  ;  Surgeon: Alexis Frock, MD;  Location: [facility];  Service: Urology;  Laterality: N/A;  with block        Home Medications    Prior to Admission medications   Medication Sig Start Date End Date Taking? Authorizing Provider  acetaminophen (TYLENOL) 500 MG tablet Take 500 mg by mouth every 6 (six) hours as needed for mild pain.   Yes [provider]  loratadine (CLARITIN) 10 MG tablet Take 1 tablet (10 mg total) by mouth daily. 06/30/18  Yes Couture, Cortni S, PA-C  omeprazole (PRILOSEC) 20 MG capsule Take 1 capsule (20 mg total) by mouth daily. Patient taking differently: Take 20 mg by mouth daily as needed  (reflux).  11/14/16  Yes Waynetta Pean, PA-C  albuterol (PROVENTIL HFA;VENTOLIN HFA) 108 (90 Base) MCG/ACT inhaler Inhale 2 puffs into the lungs every 4 (four) hours as needed for wheezing or shortness of breath. Patient not taking: Reported on 10/06/2018 02/29/16   Lajean Saver, MD  benzonatate (TESSALON) 100 MG capsule Take 1 capsule (100 mg total) by mouth 3 (three) times daily as needed for cough. Patient not taking: Reported on 01/13/2018 11/14/16   Waynetta Pean, PA-C  fluticasone [facility]) 50 MCG/ACT nasal spray Place 2 sprays into both nostrils daily. Patient not taking: Reported on 10/06/2018 06/30/18   Couture, Cortni S, PA-C  naproxen (NAPROSYN) 250 MG tablet Take 1 tablet (250 mg total) by mouth 2 (two) times daily with a meal. Patient not taking: Reported on 01/13/2018 11/14/16   Waynetta Pean, PA-C  Skin Protectants, Misc. (EUCERIN) cream Apply topically as needed for dry skin. Apply to affected areas Patient not taking: Reported on 10/06/2018 06/30/18   Couture, Cortni S, PA-C  triamcinolone (NASACORT AQ) 55 MCG/ACT AERO nasal inhaler Place 2 sprays into the nose daily. Patient not taking: Reported on 01/13/2018 11/14/16   Waynetta Pean, PA-C  triamcinolone cream (KENALOG) 0.1 % Apply 1 application topically 2 (two) times daily for 5 days. 10/06/18 10/11/18  Lorayne Bender, PA-C    Family History History reviewed. No pertinent family history.  Social History Social History  Tobacco Use  . Smoking status: Never Smoker  . Smokeless tobacco: Never Used  Substance Use Topics  . Alcohol use: No  . Drug use: No     Allergies   Penicillins and Zyrtec [cetirizine]   Review of Systems Review of Systems  Constitutional: Negative for chills, diaphoresis and fever.  HENT: Negative for congestion, facial swelling and rhinorrhea.   Eyes: Negative for visual disturbance.  Respiratory: Negative for cough and shortness of breath.   Cardiovascular: Negative for chest pain.    Gastrointestinal: Negative for abdominal pain, diarrhea, nausea and vomiting.  Musculoskeletal: Negative for neck pain and neck stiffness.  Skin:       Itching to the scalp and skin of the neck  Neurological: Positive for headaches. Negative for dizziness, syncope, weakness, light-headedness and numbness.  All other systems reviewed and are negative.    Physical Exam Updated Vital Signs BP 131/81 (BP Location: Right Arm)   Pulse 76   Temp 98 F (36.7 C) (Oral)   Resp 20   SpO2 100%   Physical Exam  Constitutional: He appears well-developed and well-nourished. No distress.  HENT:  Head: Normocephalic and atraumatic.  Mouth/Throat: Oropharynx is clear and moist.  Eyes: Pupils are equal, round, and reactive to light. Conjunctivae and EOM are normal.  Neck: Normal range of motion. Neck supple.  Cardiovascular: Normal rate, regular rhythm, normal heart sounds and intact distal pulses.  Pulmonary/Chest: Effort normal and breath sounds normal. No respiratory distress.  Abdominal: There is no guarding.  Musculoskeletal: He exhibits no edema.  Lymphadenopathy:    He has no cervical adenopathy.  Neurological: He is alert.  Skin: Skin is warm and dry. He is not diaphoretic.  Patient indicates the area of itching is to the left posterior scalp and posterior neck.  I did not note any erythema, lesions, swelling, alopecia, lymphadenopathy, tenderness, or any other abnormalities.  Psychiatric: He has a normal mood and affect. His behavior is normal.  Nursing note and vitals reviewed.    ED Treatments / Results  Labs (all labs ordered are listed, but only abnormal results are displayed) Labs Reviewed - No data to display  EKG None  Radiology No results found.  Procedures Procedures (including critical care time)  Medications Ordered in ED Medications - No data to display   Initial Impression / Assessment and Plan / ED Course  I have reviewed the triage vital signs and the  nursing notes.  Pertinent labs & imaging results that were available during my care of the patient were reviewed by me and considered in my medical decision making (see chart for details).     Patient presents with itching to the back of the head and the neck.  I do not find any evidence of cutaneous eruption.  Since the patient's symptoms are localized and cutaneous findings may perhaps be intermittent, we will try a topical steroid.  We discussed strategies for treating his headaches, which sound like tension type headaches.  He has been advised to follow-up with his PCP and/or dermatology. The patient was given instructions for home care as well as return precautions. Patient voices understanding of these instructions, accepts the plan, and is comfortable with discharge.  Final Clinical Impressions(s) / ED Diagnoses   Final diagnoses:  Itchy skin    ED Discharge Orders         Ordered    triamcinolone cream (KENALOG) 0.1 %  2 times daily     10/06/18 1114  Lorayne Bender, PA-C 10/07/18 1415    Julianne Rice, MD 10/12/18 [phone]

## 2018-10-06 NOTE — Discharge Instructions (Signed)
Apply the triamcinolone cream to the itchy areas twice daily for 5 to 7 days.  Do not apply to areas around the eyes or genitals. Follow-up with your primary care provider for further management of this issue.  May also need to follow-up with a dermatologist.   Headache For future headaches please try the following regimen: Antiinflammatory medications: Take 600 mg of ibuprofen every 6 hours or 440 mg (over the counter dose) to 500 mg (prescription dose) of naproxen every 12 hours for the next 3 days. After this time, these medications may be used as needed for pain. Take these medications with food to avoid upset stomach. Choose only one of these medications, do not take them together. Tylenol: Should you continue to have additional pain while taking the ibuprofen or naproxen, you may add in tylenol as needed. Your daily total maximum amount of tylenol from all sources should be limited to 4000mg /day for persons without liver problems, or 2000mg /day for those with liver problems.  Hydration: Have a goal of about a half liter of water every couple hours to stay well hydrated.   Sleep: Please be sure to get plenty of sleep with a goal of 8 hours per night. Having a regular bed time and bedtime routine can help with this.  Screens: Reduce the amount of time you are in front of screens.  Take about a 5-10-minute break every hour or every couple hours to give your eyes rest.  Do not use screens in dark rooms.  Glasses with a blue light filter may also help reduce eye fatigue.  Stress: Take steps to reduce stress as much as possible.   Follow up: Follow-up with your primary care provider on this issue.  May also need to follow-up with the neurologist for increased frequency of headaches.

## 2018-10-06 NOTE — ED Triage Notes (Signed)
Pt in stating he is being exposed to mold at his house, c/o rash on his face and dryness to scalp, no distress noted

## 2018-12-24 ENCOUNTER — Emergency Department (HOSPITAL_COMMUNITY): Payer: Medicaid Other

## 2018-12-24 ENCOUNTER — Encounter (HOSPITAL_COMMUNITY): Payer: Self-pay | Admitting: *Deleted

## 2018-12-24 ENCOUNTER — Emergency Department (HOSPITAL_COMMUNITY)
Admission: EM | Admit: 2018-12-24 | Discharge: 2018-12-24 | Disposition: A | Discharge status: Home / Self Care | Payer: Medicaid Other | Attending: Emergency Medicine | Admitting: Emergency Medicine

## 2018-12-24 DIAGNOSIS — Z79899 Other long term (current) drug therapy: Secondary | ICD-10-CM | POA: Insufficient documentation

## 2018-12-24 DIAGNOSIS — R0789 Other chest pain: Secondary | ICD-10-CM | POA: Diagnosis present

## 2018-12-24 DIAGNOSIS — R05 Cough: Secondary | ICD-10-CM | POA: Insufficient documentation

## 2018-12-24 DIAGNOSIS — J45909 Unspecified asthma, uncomplicated: Secondary | ICD-10-CM | POA: Diagnosis not present

## 2018-12-24 LAB — BASIC METABOLIC PANEL
Anion gap: 8 (ref 5–15)
BUN: 7 mg/dL (ref 6–20)
CHLORIDE: 107 mmol/L (ref 98–111)
CO2: 27 mmol/L (ref 22–32)
CREATININE: 1.41 mg/dL — AB (ref 0.61–1.24)
Calcium: 9.5 mg/dL (ref 8.9–10.3)
GFR calc non Af Amer: >60 mL/min (ref >60)
Glucose, Bld: 97 mg/dL (ref 70–99)
Potassium: 3.7 mmol/L (ref 3.5–5.1)
Sodium: 142 mmol/L (ref 135–145)

## 2018-12-24 LAB — CBC
HEMATOCRIT: 49.6 % (ref 39.0–52.0)
Hemoglobin: 16 g/dL (ref 13.0–17.0)
MCH: 30.6 pg (ref 26.0–34.0)
MCHC: 32.3 g/dL (ref 30.0–36.0)
MCV: 94.8 fL (ref 80.0–100.0)
NRBC: 0 % (ref 0.0–0.2)
Platelets: 341 10*3/uL (ref 150–400)
RBC: 5.23 MIL/uL (ref 4.22–5.81)
RDW: 13.1 % (ref 11.5–15.5)
WBC: 7 10*3/uL (ref 4.0–10.5)

## 2018-12-24 LAB — I-STAT TROPONIN, ED: Troponin i, poc: 0 ng/mL (ref 0.00–0.08)

## 2018-12-24 LAB — D-DIMER, QUANTITATIVE: D-Dimer, Quant: 0.47 ug/mL-FEU (ref 0.00–0.50)

## 2018-12-24 NOTE — Discharge Instructions (Signed)
Today your blood clot marker was not elevated.  Your pain may be related to heartburn due to the chips that you ate.  Please schedule an appointment with your primary care doctor.  Your blood pressure was slightly elevated here while in the emergency room, this can be due to pain, anxiety, and being in the emergency room.  If you have any concerns, develop shortness of breath, your pain radiates down your left arm, you develop nausea or any new symptoms please seek additional medical care and evaluation.

## 2018-12-24 NOTE — ED Provider Notes (Signed)
[facility] EMERGENCY DEPARTMENT Provider Note   CSN: [id number] Arrival date & time: 12/24/18  1055     History   Chief Complaint Chief Complaint  Patient presents with  . Chest Pain    HPI Cody [name] is a 28 y.o. male who presents today for chest pain.  He woke up yesterday with it.  He denies shortness of breath.  His pain worsens when he moves his left arm around.    He has been trying gas pills and omeprazole at home with out relief.  He also tried ibuprofen.  Ibuprofen helped for a little bit but then it came back.    He reports that he slept on his arm.   Occasional cough.  He reports that his younger brother and sister both have had blood clots, and his mother has also.  He has never had any blood clots.    HPI  Past Medical History:  Diagnosis Date  . Asthma    childhood    There are no active problems to display for this patient.   Past Surgical History:  Procedure Laterality Date  . CIRCUMCISION N/A 03/23/2013   Procedure: Circumcision  ;  Surgeon: Alexis Frock, MD;  Location: [facility];  Service: Urology;  Laterality: N/A;  with block        Home Medications    Prior to Admission medications   Medication Sig Start Date End Date Taking? Authorizing Provider  acetaminophen (TYLENOL) 500 MG tablet Take 500 mg by mouth every 6 (six) hours as needed for mild pain.    [provider]  albuterol (PROVENTIL HFA;VENTOLIN HFA) 108 (90 Base) MCG/ACT inhaler Inhale 2 puffs into the lungs every 4 (four) hours as needed for wheezing or shortness of breath. Patient not taking: Reported on 10/06/2018 02/29/16   Lajean Saver, MD  benzonatate (TESSALON) 100 MG capsule Take 1 capsule (100 mg total) by mouth 3 (three) times daily as needed for cough. Patient not taking: Reported on 01/13/2018 11/14/16   Waynetta Pean, PA-C  fluticasone [facility]) 50 MCG/ACT nasal spray Place 2 sprays into both nostrils daily. Patient not  taking: Reported on 10/06/2018 06/30/18   Couture, Cortni S, PA-C  loratadine (CLARITIN) 10 MG tablet Take 1 tablet (10 mg total) by mouth daily. 06/30/18   Couture, Cortni S, PA-C  naproxen (NAPROSYN) 250 MG tablet Take 1 tablet (250 mg total) by mouth 2 (two) times daily with a meal. Patient not taking: Reported on 01/13/2018 11/14/16   Waynetta Pean, PA-C  omeprazole (PRILOSEC) 20 MG capsule Take 1 capsule (20 mg total) by mouth daily. Patient taking differently: Take 20 mg by mouth daily as needed (reflux).  11/14/16   Waynetta Pean, PA-C  Skin Protectants, Misc. (EUCERIN) cream Apply topically as needed for dry skin. Apply to affected areas Patient not taking: Reported on 10/06/2018 06/30/18   Couture, Cortni S, PA-C  triamcinolone (NASACORT AQ) 55 MCG/ACT AERO nasal inhaler Place 2 sprays into the nose daily. Patient not taking: Reported on 01/13/2018 11/14/16   Waynetta Pean, PA-C    Family History History reviewed. No pertinent family history.  Social History Social History   Tobacco Use  . Smoking status: Never Smoker  . Smokeless tobacco: Never Used  Substance Use Topics  . Alcohol use: No  . Drug use: No     Allergies   Penicillins and Zyrtec [cetirizine]   Review of Systems Review of Systems  Constitutional: Negative for activity change, appetite change,  chills, fatigue and fever.  Respiratory: Positive for chest tightness. Negative for shortness of breath.   Cardiovascular: Positive for chest pain. Negative for palpitations and leg swelling.  Gastrointestinal: Negative for abdominal pain and constipation.  Neurological: Negative for weakness and headaches.  All other systems reviewed and are negative.    Physical Exam Updated Vital Signs BP 118/90 (BP Location: Right Arm)   Pulse (!) 56   Temp 98 F (36.7 C)   Resp 18   SpO2 100%   Physical Exam Vitals signs and nursing note reviewed.  Constitutional:      Appearance: He is well-developed.  HENT:       Head: Normocephalic and atraumatic.  Eyes:     Conjunctiva/sclera: Conjunctivae normal.     Comments: Disconjugate gaze  Neck:     Musculoskeletal: Neck supple.     Vascular: No JVD.     Trachea: No tracheal deviation.  Cardiovascular:     Rate and Rhythm: Normal rate and regular rhythm.     Pulses:          Radial pulses are 2+ on the right side and 2+ on the left side.     Heart sounds: Normal heart sounds. No murmur.  Pulmonary:     Effort: Pulmonary effort is normal. No accessory muscle usage or respiratory distress.     Breath sounds: Normal breath sounds. No decreased breath sounds, rhonchi or rales.  Chest:     Chest wall: No tenderness (Unable to re-create patient's pain with direct palpation over his chest.  He does have mild worsening pain with left arm movement.).  Abdominal:     Palpations: Abdomen is soft.     Tenderness: There is no abdominal tenderness.  Musculoskeletal:     Right lower leg: He exhibits no tenderness. No edema.     Left lower leg: He exhibits no tenderness. No edema.  Skin:    General: Skin is warm and dry.  Neurological:     General: No focal deficit present.     Mental Status: He is alert.  Psychiatric:        Mood and Affect: Mood normal.      ED Treatments / Results  Labs (all labs ordered are listed, but only abnormal results are displayed) Labs Reviewed  BASIC METABOLIC PANEL - Abnormal; Notable for the following components:      Result Value   Creatinine, Ser 1.41 (*)    All other components within normal limits  CBC  D-DIMER, QUANTITATIVE (NOT AT [facility])  I-STAT TROPONIN, ED    EKG EKG Interpretation  Date/Time:  Friday December 24 2018 11:23:34 EST Ventricular Rate:  70 PR Interval:  148 QRS Duration: 82 QT Interval:  358 QTC Calculation: 386 R Axis:   77 Text Interpretation:  Normal sinus rhythm Nonspecific ST abnormality Abnormal ECG similar to prior 9/17 Confirmed by Aletta Edouard [phone]) on 12/24/2018 2:35:24  PM   Radiology Dg Chest 2 View  Result Date: 12/24/2018 CLINICAL DATA:  Chest pain EXAM: CHEST - 2 VIEW COMPARISON:  06/27/2017 FINDINGS: Normal heart size. Lungs clear. No pneumothorax. No pleural effusion. IMPRESSION: No active cardiopulmonary disease. Electronically Signed   By: Marybelle Killings M.D.   On: 12/24/2018 12:21    Procedures Procedures (including critical care time)  Medications Ordered in ED Medications - No data to display   Initial Impression / Assessment and Plan / ED Course  I have reviewed the triage vital signs and the nursing notes.  Pertinent labs & imaging results that were available during my care of the patient were reviewed by me and considered in my medical decision making (see chart for details).  Clinical Course as of Dec 24 1530  Fri Dec 24, 2018  1524 Negative  D-Dimer, Quant: 0.47 [EH]    Clinical Course User Index [EH] Lorin Glass, PA-C   Patient is to be discharged with recommendation to follow up with PCP in regards to today's hospital visit. Chest pain is not likely of cardiac or pulmonary etiology d/t presentation, VSS, no tracheal deviation, no JVD or new murmur, RRR, breath sounds equal bilaterally, EKG without acute abnormalities, negative troponin, and negative CXR.  Given strong family history of blood clots that appeared to be unprovoked d-dimer was ordered which was negative.  Pt has been advised to return to the ED if CP becomes exertional, associated with diaphoresis or nausea, radiates to left jaw/arm, worsens or becomes concerning in any way. Pt appears reliable for follow up and is agreeable to discharge.   Final Clinical Impressions(s) / ED Diagnoses   Final diagnoses:  Atypical chest pain    ED Discharge Orders    None       Ollen Gross 12/24/18 1532    Hayden Rasmussen, MD 12/25/18 1310

## 2018-12-24 NOTE — ED Notes (Signed)
Patient verbalized understanding of discharge instructions and denies any further needs or questions at this time. VS stable. Patient ambulatory with steady gait.  

## 2018-12-24 NOTE — ED Triage Notes (Signed)
Pt in c/o chest pain that is worse with movement, started yesterday, reports some cough but states it was mild, no distress noted

## 2018-12-24 NOTE — ED Notes (Signed)
Main lab to add on d dimer 

## 2019-02-27 ENCOUNTER — Encounter (HOSPITAL_COMMUNITY): Payer: Self-pay

## 2019-02-27 ENCOUNTER — Other Ambulatory Visit: Payer: Self-pay

## 2019-02-27 ENCOUNTER — Emergency Department (HOSPITAL_COMMUNITY)
Admission: EM | Admit: 2019-02-27 | Discharge: 2019-02-27 | Disposition: A | Discharge status: Home / Self Care | Payer: Medicaid Other | Attending: Emergency Medicine | Admitting: Emergency Medicine

## 2019-02-27 DIAGNOSIS — J029 Acute pharyngitis, unspecified: Secondary | ICD-10-CM | POA: Diagnosis present

## 2019-02-27 DIAGNOSIS — J45909 Unspecified asthma, uncomplicated: Secondary | ICD-10-CM | POA: Diagnosis not present

## 2019-02-27 DIAGNOSIS — Z79899 Other long term (current) drug therapy: Secondary | ICD-10-CM | POA: Diagnosis not present

## 2019-02-27 DIAGNOSIS — J020 Streptococcal pharyngitis: Secondary | ICD-10-CM | POA: Insufficient documentation

## 2019-02-27 LAB — GROUP A STREP BY PCR: Group A Strep by PCR: DETECTED — AB

## 2019-02-27 MED ORDER — AZITHROMYCIN 250 MG PO TABS: ORAL_TABLET | ORAL | 0 refills | Status: DC

## 2019-02-27 MED ORDER — ACETAMINOPHEN 325 MG PO TABS
650.0000 mg | ORAL_TABLET | Freq: Once | ORAL | Status: AC
  Administered 2019-02-27: 650 mg via ORAL
  Filled 2019-02-27: qty 2

## 2019-02-27 NOTE — ED Provider Notes (Signed)
[facility] EMERGENCY DEPARTMENT Provider Note   CSN: [id number] Arrival date & time: 02/27/19  1035    History   Chief Complaint Chief Complaint  Patient presents with  . Rash    HPI Cody [name] is a 28 y.o. male.   HPI Patient presents to the emergency room for evaluation of throat irritation headache and scalp irritation.  Patient states he used a hair treatment last evening.  Following that he started developing a headache and some irritation to his scalp.  Patient also has been coughing and has noticed a scratchy itchy throat.  He denies any trouble with fevers or chills.  No vomiting or diarrhea.  He has not noticed any rashes.  Patient has not taken anything for the symptoms. Past Medical History:  Diagnosis Date  . Asthma    childhood    There are no active problems to display for this patient.   Past Surgical History:  Procedure Laterality Date  . CIRCUMCISION N/A 03/23/2013   Procedure: Circumcision  ;  Surgeon: Alexis Frock, MD;  Location: [facility];  Service: Urology;  Laterality: N/A;  with block        Home Medications    Prior to Admission medications   Medication Sig Start Date End Date Taking? Authorizing Provider  acetaminophen (TYLENOL) 500 MG tablet Take 500 mg by mouth every 6 (six) hours as needed for mild pain.    [provider]  albuterol (PROVENTIL HFA;VENTOLIN HFA) 108 (90 Base) MCG/ACT inhaler Inhale 2 puffs into the lungs every 4 (four) hours as needed for wheezing or shortness of breath. Patient not taking: Reported on 10/06/2018 02/29/16   Lajean Saver, MD  azithromycin (ZITHROMAX Z-PAK) 250 MG tablet Take 2 tabs by mouth on day 1 then 1 tab by mouth daily on days 2-5 02/27/19   Dorie Rank, MD  benzonatate (TESSALON) 100 MG capsule Take 1 capsule (100 mg total) by mouth 3 (three) times daily as needed for cough. Patient not taking: Reported on 01/13/2018 11/14/16   Waynetta Pean, PA-C    fluticasone [facility]) 50 MCG/ACT nasal spray Place 2 sprays into both nostrils daily. Patient not taking: Reported on 10/06/2018 06/30/18   Couture, Cortni S, PA-C  loratadine (CLARITIN) 10 MG tablet Take 1 tablet (10 mg total) by mouth daily. 06/30/18   Couture, Cortni S, PA-C  naproxen (NAPROSYN) 250 MG tablet Take 1 tablet (250 mg total) by mouth 2 (two) times daily with a meal. Patient not taking: Reported on 01/13/2018 11/14/16   Waynetta Pean, PA-C  omeprazole (PRILOSEC) 20 MG capsule Take 1 capsule (20 mg total) by mouth daily. Patient taking differently: Take 20 mg by mouth daily as needed (reflux).  11/14/16   Waynetta Pean, PA-C  Skin Protectants, Misc. (EUCERIN) cream Apply topically as needed for dry skin. Apply to affected areas Patient not taking: Reported on 10/06/2018 06/30/18   Couture, Cortni S, PA-C  triamcinolone (NASACORT AQ) 55 MCG/ACT AERO nasal inhaler Place 2 sprays into the nose daily. Patient not taking: Reported on 01/13/2018 11/14/16   Waynetta Pean, PA-C    Family History History reviewed. No pertinent family history.  Social History Social History   Tobacco Use  . Smoking status: Never Smoker  . Smokeless tobacco: Never Used  Substance Use Topics  . Alcohol use: No  . Drug use: No     Allergies   Penicillins and Zyrtec [cetirizine]   Review of Systems Review of Systems  All other systems  reviewed and are negative.    Physical Exam Updated Vital Signs BP 115/74 (BP Location: Right Arm)   Pulse 67   Temp 98.8 F (37.1 C) (Oral)   Ht 1.803 m (5\' 11" )   Wt 98 kg   SpO2 100%   BMI 30.13 kg/m   Physical Exam Vitals signs and nursing note reviewed.  Constitutional:      General: He is not in acute distress.    Appearance: He is well-developed.  HENT:     Head: Normocephalic and atraumatic.     Right Ear: External ear normal.     Left Ear: External ear normal.     Nose: No congestion.     Mouth/Throat:     Mouth: Mucous membranes  are moist.     Pharynx: Posterior oropharyngeal erythema present. No oropharyngeal exudate.  Eyes:     General: No scleral icterus.       Right eye: No discharge.        Left eye: No discharge.     Conjunctiva/sclera: Conjunctivae normal.  Neck:     Musculoskeletal: Neck supple.     Trachea: No tracheal deviation.  Cardiovascular:     Rate and Rhythm: Normal rate.  Pulmonary:     Effort: Pulmonary effort is normal. No respiratory distress.     Breath sounds: No stridor.  Abdominal:     General: There is no distension.  Musculoskeletal:        General: No swelling or deformity.  Skin:    General: Skin is warm and dry.     Findings: No rash.     Comments: No rashes noted, no urticaria, scalp without any irritation or drainage  Neurological:     Mental Status: He is alert.     Cranial Nerves: Cranial nerve deficit: no gross deficits.      ED Treatments / Results  Labs (all labs ordered are listed, but only abnormal results are displayed) Labs Reviewed  GROUP A STREP BY PCR - Abnormal; Notable for the following components:      Result Value   Group A Strep by PCR DETECTED (*)    All other components within normal limits     Procedures Procedures (including critical care time)  Medications Ordered in ED Medications  acetaminophen (TYLENOL) tablet 650 mg (650 mg Oral Given 02/27/19 1124)     Initial Impression / Assessment and Plan / ED Course  I have reviewed the triage vital signs and the nursing notes.  Pertinent labs & imaging results that were available during my care of the patient were reviewed by me and considered in my medical decision making (see chart for details).   Patient presented to the emergency room for evaluation of headache and throat irritation.  Patient felt to symptoms could have been related to a hair treatment.  No evidence of any urticaria on exam to suggest an allergic reaction.  Strep test is positive.  Suspect this is the etiology for his  throat irritation and headache.  Plan on discharge home with prescription for azithromycin.  Final Clinical Impressions(s) / ED Diagnoses   Final diagnoses:  Strep pharyngitis    ED Discharge Orders         Ordered    azithromycin (ZITHROMAX Z-PAK) 250 MG tablet     02/27/19 1211           Dorie Rank, MD 02/27/19 1214

## 2019-02-27 NOTE — ED Triage Notes (Signed)
Pt presents to ED with complaints of "burning where the rash on the back of my head is after I put a hair dye rinse on it last night. I've had the rash for months but I forgot about it and now it burns a little bit". No rash visible, no abnormalities noted to head.

## 2019-02-27 NOTE — Discharge Instructions (Addendum)
Take the antibiotics as prescribed, take Tylenol or ibuprofen as needed for headache, follow-up with a primary care doctor if not improving in the next week

## 2019-02-27 NOTE — ED Notes (Signed)
Patient verbalizes understanding of discharge instructions. Opportunity for questioning and answers were provided. Armband removed by staff, pt discharged from ED ambulatory.   

## 2019-03-22 MED FILL — OMEPRAZOLE 20 MG CPDR: 20 | 30 days supply | Qty: 30 | Fill #0

## 2019-03-23 MED FILL — PROAIR HFA 90 MCG INHALER: 108 (90 BAS | 17 days supply | Qty: 9 | Fill #0

## 2019-05-10 IMAGING — CR DG CHEST 2V
2 series · 2 of 2 positions shown · non-contrast
Comparison: 06/27/2017

CLINICAL DATA: Chest pain

EXAM:
CHEST - 2 VIEW

[chest pa]
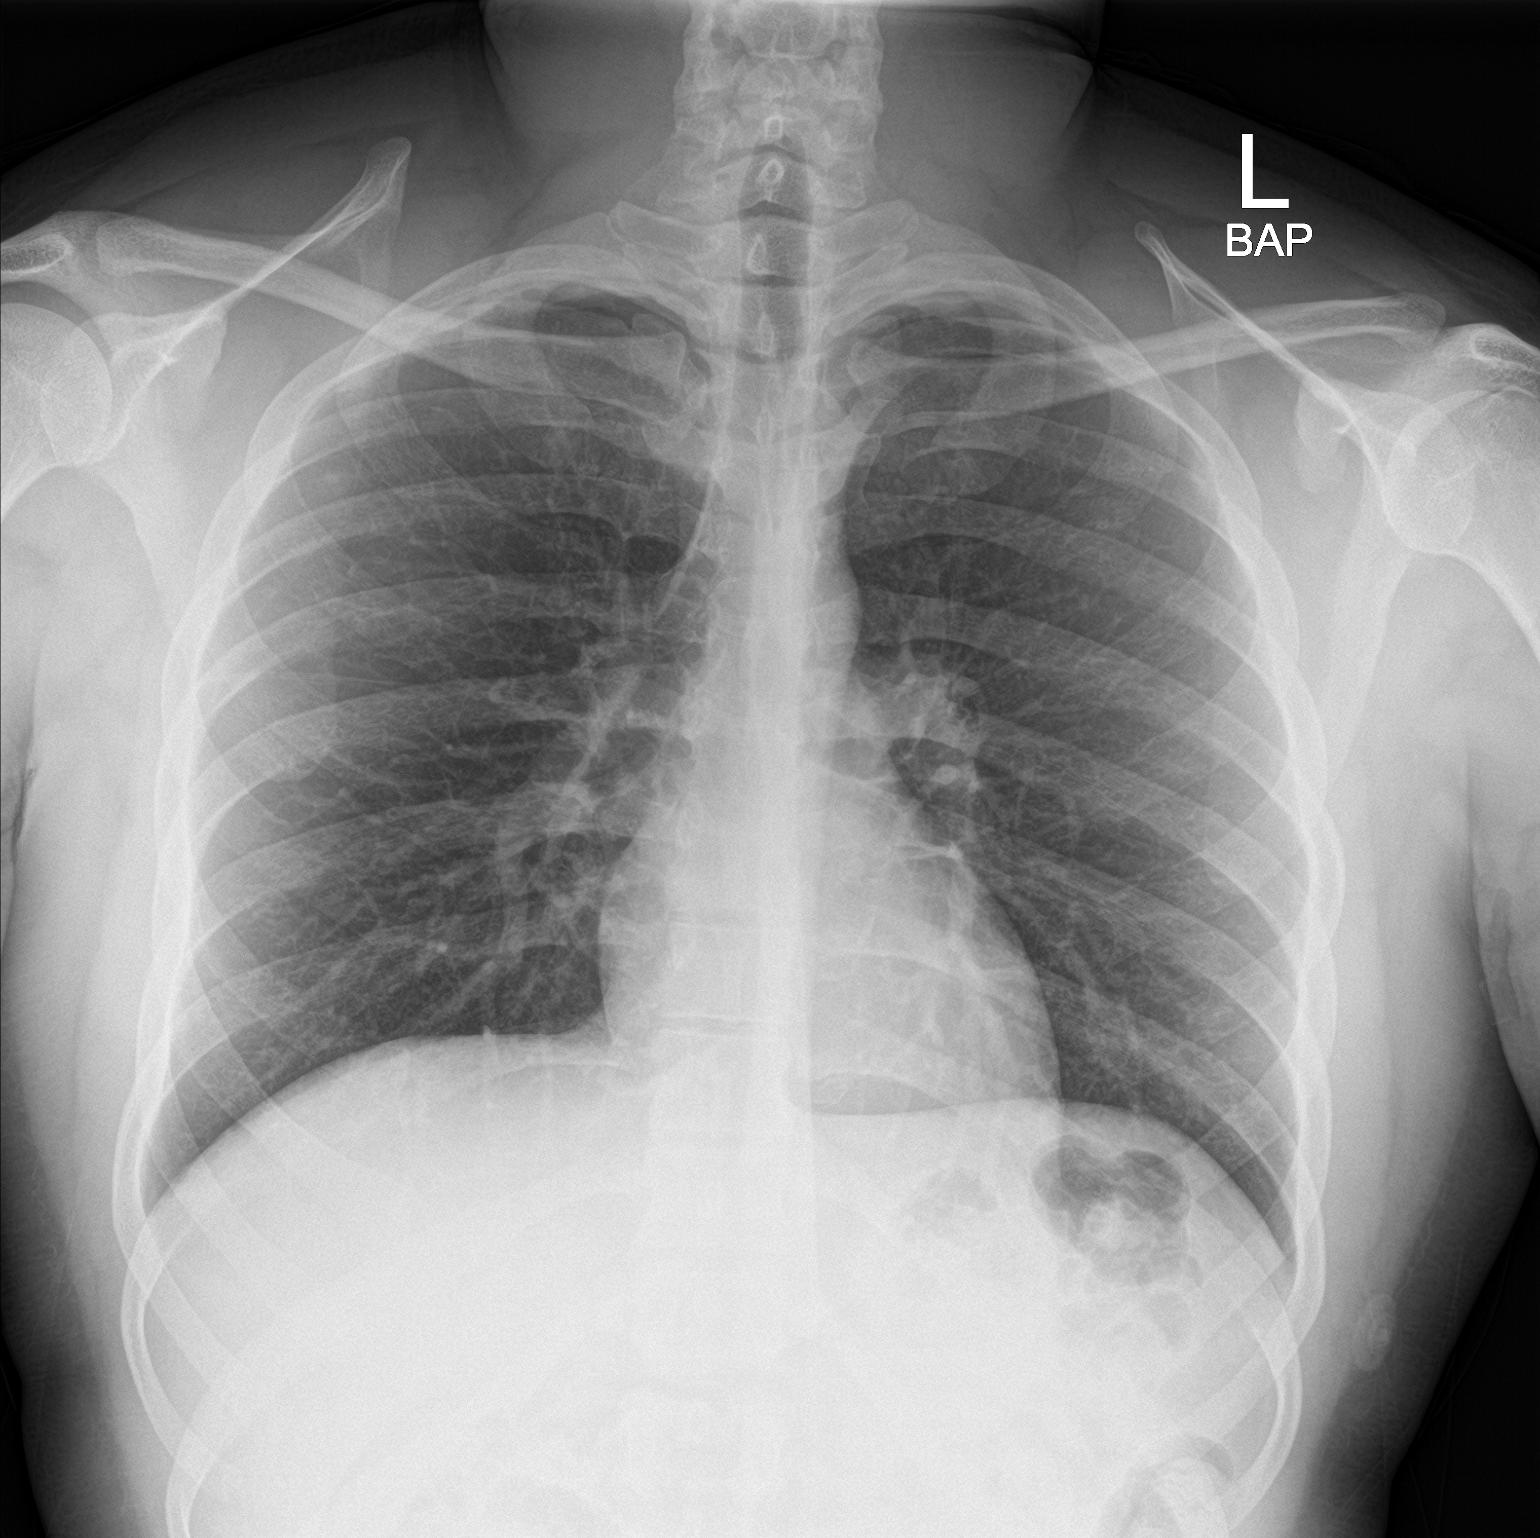

[chest lat]
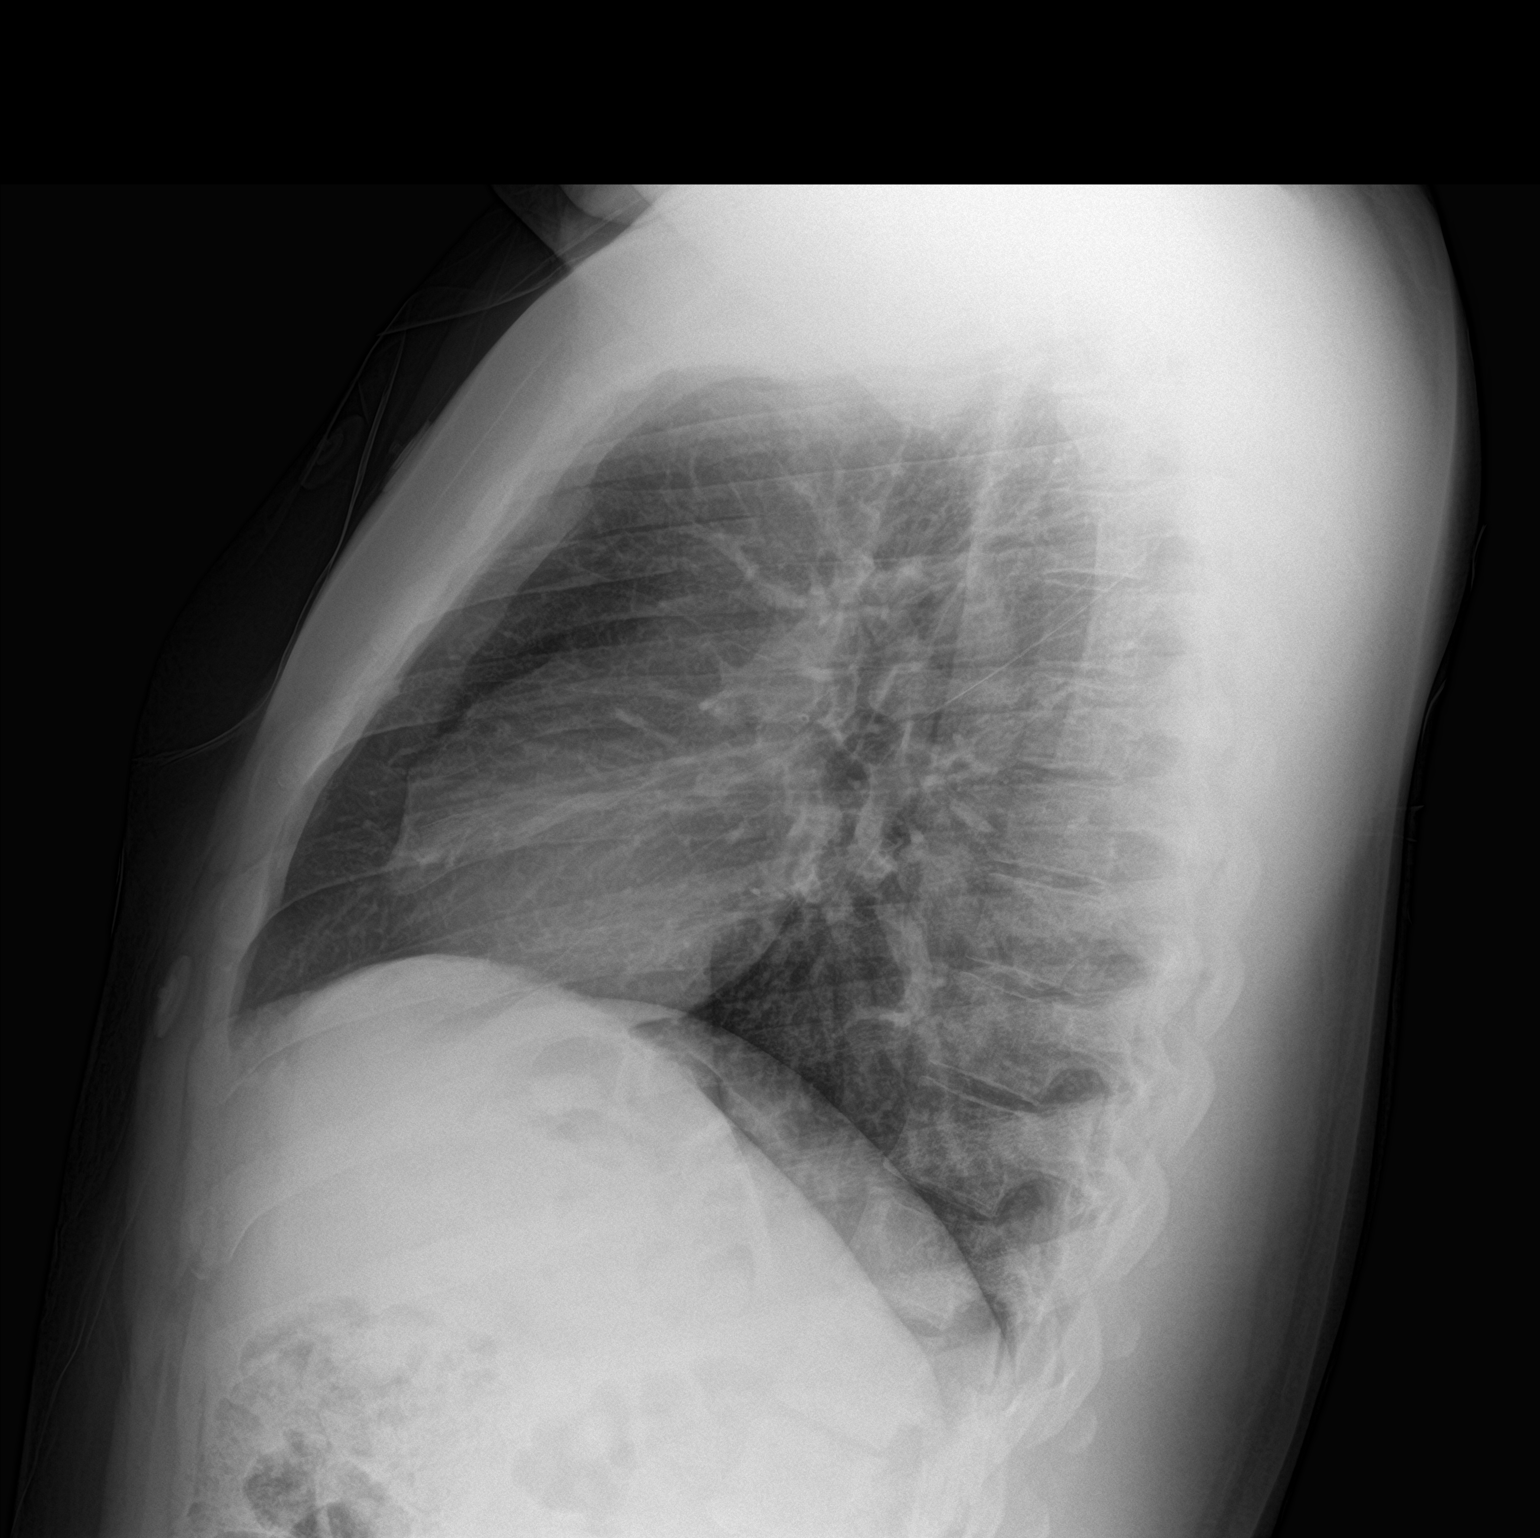

[2 of 2 positions shown; findings below may reference images not displayed]

FINDINGS: Normal heart size. Lungs clear. No pneumothorax. No pleural
effusion.
IMPRESSION: No active cardiopulmonary disease.

## 2019-05-13 MED FILL — PANTOPRAZOLE SOD DR 40 MG T: 40 | 30 days supply | Qty: 30 | Fill #0

## 2019-06-07 MED FILL — PROAIR HFA 90 MCG INHALER: 108 (90 BAS | 17 days supply | Qty: 9 | Fill #1

## 2019-06-07 MED FILL — PANTOPRAZOLE SOD DR 40 MG T: 40 | 30 days supply | Qty: 30 | Fill #1

## 2019-06-07 MED FILL — OMEPRAZOLE 20 MG CPDR: 20 | 30 days supply | Qty: 30 | Fill #1

## 2020-01-23 ENCOUNTER — Other Ambulatory Visit: Payer: Self-pay | Admitting: Gastroenterology

## 2020-02-08 ENCOUNTER — Ambulatory Visit (HOSPITAL_COMMUNITY): Admit: 2020-02-08 | Payer: Medicaid Other | Admitting: Gastroenterology

## 2020-02-08 ENCOUNTER — Encounter (HOSPITAL_COMMUNITY): Payer: Self-pay

## 2020-02-08 SURGERY — MANOMETRY, ESOPHAGUS

## 2020-03-20 ENCOUNTER — Ambulatory Visit (HOSPITAL_COMMUNITY)
Admission: EM | Admit: 2020-03-20 | Discharge: 2020-03-20 | Disposition: A | Discharge status: Home / Self Care | Payer: Medicaid Other | Attending: Internal Medicine | Admitting: Internal Medicine

## 2020-03-20 ENCOUNTER — Other Ambulatory Visit: Payer: Self-pay

## 2020-03-20 ENCOUNTER — Encounter (HOSPITAL_COMMUNITY): Payer: Self-pay

## 2020-03-20 DIAGNOSIS — S91332A Puncture wound without foreign body, left foot, initial encounter: Secondary | ICD-10-CM

## 2020-03-20 MED ORDER — TETANUS-DIPHTH-ACELL PERTUSSIS 5-2.5-18.5 LF-MCG/0.5 IM SUSP
0.5000 mL | Freq: Once | INTRAMUSCULAR | Status: AC
  Administered 2020-03-20: 0.5 mL via INTRAMUSCULAR

## 2020-03-20 MED ORDER — TETANUS-DIPHTH-ACELL PERTUSSIS 5-2.5-18.5 LF-MCG/0.5 IM SUSP
INTRAMUSCULAR | Status: AC
  Filled 2020-03-20: qty 0.5

## 2020-03-20 MED ORDER — LEVOFLOXACIN 500 MG PO TABS: 500.0000 mg | ORAL_TABLET | Freq: Every day | ORAL | 0 refills | Status: AC

## 2020-03-20 NOTE — ED Provider Notes (Signed)
[place]    CSN: [id number] Arrival date & time: 03/20/20  [place]      History   Chief Complaint Chief Complaint  Patient presents with  . Stepped on Rusty Nail    HPI Cody [name] is a 29 y.o. male to urgent care after he stepped on a rusty nail.  Patient was taking trash out to the dumpster in an apartment complex.  The nail went through his footwear.  The puncture wound bled for a while but has currently subsided.  No swelling.  Pain is currently mild.  Patient is able to ambulate on the foot.  No nausea or vomiting.  No tetanus shot in the past several years. HPI  Past Medical History:  Diagnosis Date  . Asthma    childhood    There are no problems to display for this patient.   Past Surgical History:  Procedure Laterality Date  . CIRCUMCISION N/A 03/23/2013   Procedure: Circumcision  ;  Surgeon: Alexis Frock, MD;  Location: [facility];  Service: Urology;  Laterality: N/A;  with block       Home Medications    Prior to Admission medications   Medication Sig Start Date End Date Taking? Authorizing Provider  acetaminophen (TYLENOL) 500 MG tablet Take 500 mg by mouth every 6 (six) hours as needed for mild pain.    [provider]  azithromycin (ZITHROMAX Z-PAK) 250 MG tablet Take 2 tabs by mouth on day 1 then 1 tab by mouth daily on days 2-5 02/27/19   Dorie Rank, MD  levofloxacin (LEVAQUIN) 500 MG tablet Take 1 tablet (500 mg total) by mouth daily for 3 days. 03/20/20 03/23/20  Chase Picket, MD  loratadine (CLARITIN) 10 MG tablet Take 1 tablet (10 mg total) by mouth daily. 06/30/18   Couture, Cortni S, PA-C  omeprazole (PRILOSEC) 20 MG capsule Take 1 capsule (20 mg total) by mouth daily. Patient taking differently: Take 20 mg by mouth daily as needed (reflux).  11/14/16   Waynetta Pean, PA-C  albuterol (PROVENTIL HFA;VENTOLIN HFA) 108 (90 Base) MCG/ACT inhaler Inhale 2 puffs into the lungs every 4 (four) hours as needed for  wheezing or shortness of breath. Patient not taking: Reported on 10/06/2018 02/29/16 03/20/20  Lajean Saver, MD  fluticasone [facility]) 50 MCG/ACT nasal spray Place 2 sprays into both nostrils daily. Patient not taking: Reported on 10/06/2018 06/30/18 03/20/20  Couture, Cortni S, PA-C  triamcinolone (NASACORT AQ) 55 MCG/ACT AERO nasal inhaler Place 2 sprays into the nose daily. Patient not taking: Reported on 01/13/2018 11/14/16 03/20/20  Waynetta Pean, PA-C    Family History Family History  Family history unknown: Yes    Social History Social History   Tobacco Use  . Smoking status: Never Smoker  . Smokeless tobacco: Never Used  Substance Use Topics  . Alcohol use: No  . Drug use: No     Allergies   Penicillins and Zyrtec [cetirizine]   Review of Systems Review of Systems  Constitutional: Negative for chills, fatigue and fever.  Genitourinary: Negative.   Musculoskeletal: Positive for arthralgias. Negative for gait problem and joint swelling.  Skin: Negative.  Negative for color change and pallor.  Neurological: Negative for dizziness, light-headedness and headaches.  Hematological: Negative.      Physical Exam Triage Vital Signs ED Triage Vitals  Enc Vitals Group     BP 03/20/20 1813 116/68     Pulse Rate 03/20/20 1813 67     Resp 03/20/20  1813 16     Temp 03/20/20 1813 (!) 89.6 F (32 C)     Temp Source 03/20/20 1813 Oral     SpO2 03/20/20 1813 99 %     Weight --      Height --      Head Circumference --      Peak Flow --      Pain Score 03/20/20 1825 4     Pain Loc --      Pain Edu? --      Excl. in [place]? --    No data found.  Updated Vital Signs BP 116/68 (BP Location: Left Arm)   Pulse 67   Temp 98.6 F (37 C) (Oral)   Resp 16   SpO2 99%   Visual Acuity Right Eye Distance:   Left Eye Distance:   Bilateral Distance:    Right Eye Near:   Left Eye Near:    Bilateral Near:     Physical Exam Vitals and nursing note reviewed.  Constitutional:       General: He is not in acute distress.    Appearance: He is not ill-appearing.  Cardiovascular:     Rate and Rhythm: Normal rate and regular rhythm.     Pulses: Normal pulses.     Heart sounds: Normal heart sounds.  Pulmonary:     Effort: Pulmonary effort is normal.     Breath sounds: Normal breath sounds.  Skin:    General: Skin is warm.     Capillary Refill: Capillary refill takes less than 2 seconds.     Comments: Puncture wound on the plantar surface of the left foot  Neurological:     General: No focal deficit present.     Mental Status: He is alert.      UC Treatments / Results  Labs (all labs ordered are listed, but only abnormal results are displayed) Labs Reviewed - No data to display  EKG   Radiology No results found.  Procedures Procedures (including critical care time)  Medications Ordered in UC Medications  Tdap (BOOSTRIX) injection 0.5 mL (has no administration in time range)    Initial Impression / Assessment and Plan / UC Course  I have reviewed the triage vital signs and the nursing notes.  Pertinent labs & imaging results that were available during my care of the patient were reviewed by me and considered in my medical decision making (see chart for details).    1.  Puncture wound on the plantar surface of the left foot: Tetanus shot Levaquin prophylaxis (500 mg orally daily for 3 days) Wash area with soap and water Topical antibiotic applied to the area Return precautions given If patient notices any redness, swelling, fever, chills, increasing pain he is advised to return to urgent care to be reevaluated. Final Clinical Impressions(s) / UC Diagnoses   Final diagnoses:  Puncture wound of left foot, initial encounter   Discharge Instructions   None    ED Prescriptions    Medication Sig Dispense Auth. Provider   levofloxacin (LEVAQUIN) 500 MG tablet Take 1 tablet (500 mg total) by mouth daily for 3 days. 3 tablet Lakyn Alsteen, Myrene Galas,  MD     PDMP not reviewed this encounter.   Chase Picket, MD 03/20/20 [phone]

## 2020-03-20 NOTE — ED Triage Notes (Signed)
Pt presents with puncture wound on left foot from stepping on a rusty nail while taking out his trash; pt states he is not up to date on tetanus.

## 2020-06-14 ENCOUNTER — Ambulatory Visit (HOSPITAL_COMMUNITY)
Admission: EM | Admit: 2020-06-14 | Discharge: 2020-06-14 | Disposition: A | Discharge status: Home / Self Care | Payer: Medicaid Other

## 2020-06-14 ENCOUNTER — Encounter (HOSPITAL_COMMUNITY): Payer: Self-pay | Admitting: Emergency Medicine

## 2020-06-14 DIAGNOSIS — K5900 Constipation, unspecified: Secondary | ICD-10-CM

## 2020-06-14 HISTORY — DX: Gastro-esophageal reflux disease without esophagitis: K21.9

## 2020-06-14 HISTORY — DX: Anxiety disorder, unspecified: F41.9

## 2020-06-14 HISTORY — DX: Vitamin D deficiency, unspecified: E55.9

## 2020-06-14 MED ORDER — POLYETHYLENE GLYCOL 3350 17 G PO PACK: 17.0000 g | PACK | Freq: Every day | ORAL | 0 refills | Status: DC

## 2020-06-14 NOTE — ED Triage Notes (Signed)
Pt c/o constipation with lower leg tingling and numbness and lower back pain. Pt also states he has a smell in his nose that smells like infection and like feces. Pt states he felt constipated and took Linzess 290 mg, 2 days ago and then again yesterday. Pt states yesterday he had constant diarrhea after taking linzess. Pt states he is having numbness and tingling in both lower legs.

## 2020-06-14 NOTE — ED Provider Notes (Signed)
[place]    CSN: [id number] Arrival date & time: 06/14/20  1033      History   Chief Complaint Chief Complaint  Patient presents with  . Constipation  . Sinus Problem  . Leg Pain    HPI Cody [name] is a 29 y.o. male.   The history is provided by the patient. No language interpreter was used.  Constipation Severity:  Moderate Timing:  Constant Progression:  Worsening Chronicity:  New Stool description:  None produced Relieved by:  Nothing Worsened by:  Nothing Ineffective treatments:  None tried Sinus Problem  Leg Pain  Pt reports he took linzess x 2 days.  Pt reports he has had diarrhea since.   Past Medical History:  Diagnosis Date  . Anxiety   . Asthma    childhood  . GERD (gastroesophageal reflux disease)   . Vitamin D deficiency     There are no problems to display for this patient.   Past Surgical History:  Procedure Laterality Date  . CIRCUMCISION N/A 03/23/2013   Procedure: Circumcision  ;  Surgeon: Alexis Frock, MD;  Location: [facility];  Service: Urology;  Laterality: N/A;  with block       Home Medications    Prior to Admission medications   Medication Sig Start Date End Date Taking? Authorizing Provider  acetaminophen (TYLENOL) 500 MG tablet Take 500 mg by mouth every 6 (six) hours as needed for mild pain.    [provider]  azithromycin (ZITHROMAX Z-PAK) 250 MG tablet Take 2 tabs by mouth on day 1 then 1 tab by mouth daily on days 2-5 02/27/19   Dorie Rank, MD  DEXILANT 60 MG capsule Take 1 capsule by mouth daily. 03/27/20   [provider]  escitalopram (LEXAPRO) 10 MG tablet Take 10 mg by mouth daily. 05/22/20   [provider]  famotidine (PEPCID) 20 MG tablet Take 20 mg by mouth 2 (two) times daily. 03/14/20   [provider]  loratadine (CLARITIN) 10 MG tablet Take 1 tablet (10 mg total) by mouth daily. 06/30/18   Couture, Cortni S, PA-C  omeprazole (PRILOSEC) 20 MG capsule  Take 1 capsule (20 mg total) by mouth daily. Patient taking differently: Take 20 mg by mouth daily as needed (reflux).  11/14/16   Waynetta Pean, PA-C  polyethylene glycol (MIRALAX) 17 g packet Take 17 g by mouth daily. 06/14/20   Fransico Meadow, PA-C  Vitamin D, Ergocalciferol, (DRISDOL) 1.25 MG (50000 UNIT) CAPS capsule Take 50,000 Units by mouth once a week. 03/13/20   [provider]  albuterol (PROVENTIL HFA;VENTOLIN HFA) 108 (90 Base) MCG/ACT inhaler Inhale 2 puffs into the lungs every 4 (four) hours as needed for wheezing or shortness of breath. Patient not taking: Reported on 10/06/2018 02/29/16 03/20/20  Lajean Saver, MD  fluticasone [facility]) 50 MCG/ACT nasal spray Place 2 sprays into both nostrils daily. Patient not taking: Reported on 10/06/2018 06/30/18 03/20/20  Couture, Cortni S, PA-C  triamcinolone (NASACORT AQ) 55 MCG/ACT AERO nasal inhaler Place 2 sprays into the nose daily. Patient not taking: Reported on 01/13/2018 11/14/16 03/20/20  Waynetta Pean, PA-C    Family History Family History  Family history unknown: Yes    Social History Social History   Tobacco Use  . Smoking status: Never Smoker  . Smokeless tobacco: Never Used  Vaping Use  . Vaping Use: Never used  Substance Use Topics  . Alcohol use: No  . Drug use: No  Allergies   Penicillins and Zyrtec [cetirizine]   Review of Systems Review of Systems  Gastrointestinal: Positive for constipation.  All other systems reviewed and are negative.    Physical Exam Triage Vital Signs ED Triage Vitals  Enc Vitals Group     BP 06/14/20 1151 117/63     Pulse Rate 06/14/20 1151 60     Resp 06/14/20 1151 18     Temp 06/14/20 1151 97.9 F (36.6 C)     Temp Source 06/14/20 1151 Oral     SpO2 06/14/20 1151 100 %     Weight --      Height --      Head Circumference --      Peak Flow --      Pain Score 06/14/20 1147 0     Pain Loc --      Pain Edu? --      Excl. in [place]? --    No data  found.  Updated Vital Signs BP 117/63 (BP Location: Left Arm)   Pulse 60   Temp 97.9 F (36.6 C) (Oral)   Resp 18   SpO2 100%   Visual Acuity Right Eye Distance:   Left Eye Distance:   Bilateral Distance:    Right Eye Near:   Left Eye Near:    Bilateral Near:     Physical Exam Vitals and nursing note reviewed.  Constitutional:      Appearance: He is well-developed.  HENT:     Head: Normocephalic and atraumatic.  Eyes:     Conjunctiva/sclera: Conjunctivae normal.  Cardiovascular:     Rate and Rhythm: Normal rate and regular rhythm.     Heart sounds: No murmur heard.   Pulmonary:     Effort: Pulmonary effort is normal. No respiratory distress.     Breath sounds: Normal breath sounds.  Abdominal:     Palpations: Abdomen is soft.     Tenderness: There is no abdominal tenderness.  Musculoskeletal:     Cervical back: Neck supple.  Skin:    General: Skin is warm and dry.  Neurological:     General: No focal deficit present.     Mental Status: He is alert.  Psychiatric:        Mood and Affect: Mood normal.      UC Treatments / Results  Labs (all labs ordered are listed, but only abnormal results are displayed) Labs Reviewed - No data to display  EKG   Radiology No results found.  Procedures Procedures (including critical care time)  Medications Ordered in UC Medications - No data to display  Initial Impression / Assessment and Plan / UC Course  I have reviewed the triage vital signs and the nursing notes.  Pertinent labs & imaging results that were available during my care of the patient were reviewed by me and considered in my medical decision making (see chart for details).     MDM:  Pt counseled on exercise, increase fiber.  Pt advised to stop linzess.  Pt advised to try miralax instead.  Final Clinical Impressions(s) / UC Diagnoses   Final diagnoses:  Constipation, unspecified constipation type     Discharge Instructions     Stop taking  LInzess. Eat a high fiber diet.  Use miralax for the next 2 weeks    ED Prescriptions    Medication Sig Dispense Auth. Provider   polyethylene glycol (MIRALAX) 17 g packet Take 17 g by mouth daily. 14 each Fransico Meadow, [place]  PDMP not reviewed this encounter.  An After Visit Summary was printed and given to the patient.    Fransico Meadow, [place] 06/14/20 1831

## 2020-06-14 NOTE — Discharge Instructions (Addendum)
Stop taking LInzess. Eat a high fiber diet.  Use miralax for the next 2 weeks

## 2020-09-27 ENCOUNTER — Encounter: Payer: Self-pay | Admitting: Family Medicine

## 2020-09-27 ENCOUNTER — Ambulatory Visit: Payer: Self-pay

## 2020-09-27 ENCOUNTER — Other Ambulatory Visit: Payer: Self-pay

## 2020-09-27 ENCOUNTER — Other Ambulatory Visit: Payer: Self-pay | Admitting: Family Medicine

## 2020-09-27 ENCOUNTER — Ambulatory Visit (INDEPENDENT_AMBULATORY_CARE_PROVIDER_SITE_OTHER): Payer: Medicaid Other | Admitting: Family Medicine

## 2020-09-27 DIAGNOSIS — M25562 Pain in left knee: Secondary | ICD-10-CM | POA: Diagnosis not present

## 2020-09-27 MED ORDER — DICLOFENAC SODIUM 1 % EX GEL: 4.0000 g | Freq: Four times a day (QID) | CUTANEOUS | 6 refills | Status: DC | PRN

## 2020-09-27 MED ORDER — MELOXICAM 15 MG PO TABS: 7.5000 mg | ORAL_TABLET | Freq: Every day | ORAL | 6 refills | Status: DC | PRN

## 2020-09-27 NOTE — Progress Notes (Signed)
   Office Visit Note   Patient: Cody [name]           Date of Birth: March 01, 1991           MRN: [id number] Visit Date: 09/27/2020 Requested by: Pa, [employer] [address] [place],  [place] [address] PCP: Pa, [facility]  Subjective: Chief Complaint  Patient presents with  . Left Knee - Pain    HPI: He is here with left knee pain.  In the past few days his knee has been hurting and swollen, no definite injury.  Pain on the anterior aspect.  He has been using ice but it has not made too much difference.  It bothers him mainly with walking.  He has been doing a lot of walking to try to lose weight.  No previous problems with his knee.              ROS:   All other systems were reviewed and are negative.  Objective: Vital Signs: There were no vitals taken for this visit.  Physical Exam:  General:  Alert and oriented, in no acute distress. Pulm:  Breathing unlabored. Psy:  Normal mood, congruent affect. Skin: No erythema Left knee: No effusion, good range of motion.  There is some patellofemoral clicking with active range of motion.  Patella compression is negative, patella apprehension is negative.  There is soft tissue prominence lateral to the patella tendon but it is not significantly tender.  Ligaments feel stable, no joint line tenderness.  Imaging: US Guided Needle Placement  Result Date: 09/27/2020 Limited diagnostic ultrasound of the left anterior knee reveals normal appearance of the patellar tendon.  There appears to be prominence of the infrapatellar fat pad laterally.  There is no hyperemia on power Doppler imaging.  No fluid collection.  XR Knee 1-2 Views Left  Result Date: 09/27/2020 X-rays of the left knee reveal normal anatomic alignment, no obvious bony abnormality.   Assessment & Plan: 1.  Left knee pain, suspect infrapatellar fat pad irritation -We will try meloxicam and Voltaren gel for the next week.  If no improvement, he will contact me and I will  order MRI scan to further evaluate.     Procedures: No procedures performed  No notes on file     PMFS History: There are no problems to display for this patient.  Past Medical History:  Diagnosis Date  . Anxiety   . Asthma    childhood  . GERD (gastroesophageal reflux disease)   . Vitamin D deficiency     Family History  Family history unknown: Yes    Past Surgical History:  Procedure Laterality Date  . CIRCUMCISION N/A 03/23/2013   Procedure: Circumcision  ;  Surgeon: Alexis Frock, MD;  Location: [facility];  Service: Urology;  Laterality: N/A;  with block   Social History   Occupational History  . Not on file  Tobacco Use  . Smoking status: Never Smoker  . Smokeless tobacco: Never Used  Vaping Use  . Vaping Use: Never used  Substance and Sexual Activity  . Alcohol use: No  . Drug use: No  . Sexual activity: Not on file

## 2020-09-27 NOTE — Progress Notes (Signed)
Pain for a while Been walking more to lose weight Feels something bony on lateral side of left knee

## 2020-10-11 ENCOUNTER — Other Ambulatory Visit (HOSPITAL_COMMUNITY): Payer: Self-pay | Admitting: Internal Medicine

## 2020-10-15 ENCOUNTER — Other Ambulatory Visit (HOSPITAL_COMMUNITY): Payer: Self-pay | Admitting: Gastroenterology

## 2021-01-09 ENCOUNTER — Other Ambulatory Visit (HOSPITAL_COMMUNITY): Payer: Self-pay | Admitting: Internal Medicine

## 2021-01-23 ENCOUNTER — Other Ambulatory Visit (HOSPITAL_COMMUNITY): Payer: Self-pay | Admitting: Physician Assistant

## 2021-01-24 DIAGNOSIS — F4310 Post-traumatic stress disorder, unspecified: Secondary | ICD-10-CM | POA: Diagnosis not present

## 2021-02-01 DIAGNOSIS — F4310 Post-traumatic stress disorder, unspecified: Secondary | ICD-10-CM | POA: Diagnosis not present

## 2021-02-08 DIAGNOSIS — F4310 Post-traumatic stress disorder, unspecified: Secondary | ICD-10-CM | POA: Diagnosis not present

## 2021-02-15 DIAGNOSIS — F4310 Post-traumatic stress disorder, unspecified: Secondary | ICD-10-CM | POA: Diagnosis not present

## 2021-03-01 DIAGNOSIS — F4310 Post-traumatic stress disorder, unspecified: Secondary | ICD-10-CM | POA: Diagnosis not present

## 2021-03-07 NOTE — Progress Notes (Signed)
    SUBJECTIVE:   CHIEF COMPLAINT / HPI:   New patient: Patient previously had a PCP care but has not been to a doctor in months.  Patient's concern today is the rash on the back of his head and his hairline.  He feels like the skin back there is "tight".  It is pruritic.  He has tried [employer] previously but this did not work.  Patient also concerned about bumps on his penis.  He did not notice them until after his circumcision which was in his "early 54s".  They are on the glans.  No itching or burning or discharge.  Not sexually active in the last few years.   Allergies:  Seasonal allergies. Takes loratadine.  Has not taken it in a while.  Symptoms are mostly nasal congestion.    Asthma: mild.  Worse in he was younger.  Hasn't had to use the inahler in months.    Anxiety/depression: mostly anxiety.  Takes escitalopram for the past few months. 10mg  daily. Thinks his depression is triggered by anxiety.  Takes it in the am.    Acid reflux: Famotidine and dexilant daily.  Takes dexilant in the AM.  Then waits to take his anxiety medicine.  Takes famotidine before he goes to bed.   Has been to a gastroenterologist and was told he had a hiatal hernia.  Has tried reducing his medications in the past but this worsened his symptoms.  PERTINENT  PMH / PSH: lives with brother.  Not working.  Years since he was sexually active.     OBJECTIVE:   BP 114/73   Pulse 64   Ht 5\' 11"  (1.803 m)   Wt 236 lb 3.2 oz (107.1 kg)   SpO2 98%   BMI 32.94 kg/m   General: Alert and oriented.  No acute distress. HEENT: No alopecia.  No rash on the scalp.  PERRLA, EOMI, CV: Regular rate and rhythm Pulmonary: Clear auscultation bilaterally GI: Soft, nontender to palpation.  Normal bowel sounds. GU: Small white papules along the coronal ridge.  Pain is otherwise normal. Psych: Pleasant affect, normal speech.  ASSESSMENT/PLAN:   Itchy scalp Nothing abnormal on exam.  No alopecia.  No rash visible.  No  signs of fungal infection.  Will treat with ketoconazole to the area 2-3 times a week.  Patient to follow-up if this does not improve.  Pearly penile papules Discussed with patient benign papules on his glans.  No concern for STI.  Mild intermittent asthma Appears to be improving.  Very mild.  No changes.  Gastroesophageal reflux disease without esophagitis On both PPI and famotidine.  Did not discuss it with patient at this visit but will consider discussing de-escalation of therapy at future visits.  Seasonal allergies Taking loratadine.  No changes.  Anxiety Appears to be General anxiety disorder with some component of social anxiety.  Already on escitalopram.  Compliant.  GAD-7 was normal today as well as PHQ-9.  Continue to follow-up.     Benay Pike, MD [place]

## 2021-03-08 ENCOUNTER — Other Ambulatory Visit: Payer: Self-pay

## 2021-03-08 ENCOUNTER — Encounter: Payer: Self-pay | Admitting: Family Medicine

## 2021-03-08 ENCOUNTER — Ambulatory Visit (INDEPENDENT_AMBULATORY_CARE_PROVIDER_SITE_OTHER): Payer: Medicaid Other | Admitting: Family Medicine

## 2021-03-08 ENCOUNTER — Other Ambulatory Visit: Payer: Self-pay | Admitting: Family Medicine

## 2021-03-08 VITALS — BP 114/73 | HR 64 | Ht 71.0 in | Wt 236.2 lb

## 2021-03-08 DIAGNOSIS — Z6832 Body mass index (BMI) 32.0-32.9, adult: Secondary | ICD-10-CM | POA: Diagnosis not present

## 2021-03-08 DIAGNOSIS — L299 Pruritus, unspecified: Secondary | ICD-10-CM | POA: Diagnosis not present

## 2021-03-08 DIAGNOSIS — J4520 Mild intermittent asthma, uncomplicated: Secondary | ICD-10-CM | POA: Diagnosis not present

## 2021-03-08 DIAGNOSIS — J302 Other seasonal allergic rhinitis: Secondary | ICD-10-CM

## 2021-03-08 DIAGNOSIS — E6609 Other obesity due to excess calories: Secondary | ICD-10-CM | POA: Diagnosis not present

## 2021-03-08 DIAGNOSIS — K219 Gastro-esophageal reflux disease without esophagitis: Secondary | ICD-10-CM | POA: Diagnosis present

## 2021-03-08 DIAGNOSIS — F4310 Post-traumatic stress disorder, unspecified: Secondary | ICD-10-CM | POA: Diagnosis not present

## 2021-03-08 DIAGNOSIS — R7989 Other specified abnormal findings of blood chemistry: Secondary | ICD-10-CM | POA: Diagnosis not present

## 2021-03-08 DIAGNOSIS — F419 Anxiety disorder, unspecified: Secondary | ICD-10-CM

## 2021-03-08 DIAGNOSIS — N4889 Other specified disorders of penis: Secondary | ICD-10-CM

## 2021-03-08 LAB — POCT GLYCOSYLATED HEMOGLOBIN (HGB A1C): Hemoglobin A1C: 5.4 % (ref 4.0–5.6)

## 2021-03-08 MED ORDER — KETOCONAZOLE 2 % EX SHAM: 1.0000 "application " | MEDICATED_SHAMPOO | CUTANEOUS | 0 refills | Status: DC

## 2021-03-08 NOTE — Patient Instructions (Addendum)
It was nice to meet you today,  I will refill your medications.  I have ordered some blood tests to follow-up on.  I will call you with the results when I get them.  I have prescribed an antifungal shampoo called ketoconazole for you.  You can use it on the spot on your hair that itches 3 times a week.  You can rub it into only that spot if you would like, leave it in for 5 minutes, then wash it off.  If after a month this does not help, we can discuss using a different medicated treatment.  I would like you to follow-up with me in May or June.  Have a great day,  Clemetine Marker, MD

## 2021-03-09 LAB — BASIC METABOLIC PANEL
BUN/Creatinine Ratio: 8 — ABNORMAL LOW (ref 9–20)
BUN: 10 mg/dL (ref 6–20)
CO2: 23 mmol/L (ref 20–29)
Calcium: 9.6 mg/dL (ref 8.7–10.2)
Chloride: 103 mmol/L (ref 96–106)
Creatinine, Ser: 1.2 mg/dL (ref 0.76–1.27)
Glucose: 82 mg/dL (ref 65–99)
Potassium: 4.8 mmol/L (ref 3.5–5.2)
Sodium: 141 mmol/L (ref 134–144)
eGFR: 82 mL/min/{1.73_m2} (ref >59)

## 2021-03-10 DIAGNOSIS — K219 Gastro-esophageal reflux disease without esophagitis: Secondary | ICD-10-CM | POA: Insufficient documentation

## 2021-03-10 DIAGNOSIS — F419 Anxiety disorder, unspecified: Secondary | ICD-10-CM | POA: Insufficient documentation

## 2021-03-10 DIAGNOSIS — N4889 Other specified disorders of penis: Secondary | ICD-10-CM | POA: Insufficient documentation

## 2021-03-10 DIAGNOSIS — L299 Pruritus, unspecified: Secondary | ICD-10-CM | POA: Insufficient documentation

## 2021-03-10 DIAGNOSIS — J302 Other seasonal allergic rhinitis: Secondary | ICD-10-CM | POA: Insufficient documentation

## 2021-03-10 DIAGNOSIS — J4520 Mild intermittent asthma, uncomplicated: Secondary | ICD-10-CM | POA: Insufficient documentation

## 2021-03-10 NOTE — Assessment & Plan Note (Signed)
On both PPI and famotidine.  Did not discuss it with patient at this visit but will consider discussing de-escalation of therapy at future visits.

## 2021-03-10 NOTE — Assessment & Plan Note (Signed)
Nothing abnormal on exam.  No alopecia.  No rash visible.  No signs of fungal infection.  Will treat with ketoconazole to the area 2-3 times a week.  Patient to follow-up if this does not improve.

## 2021-03-10 NOTE — Assessment & Plan Note (Signed)
Discussed with patient benign papules on his glans.  No concern for STI.

## 2021-03-10 NOTE — Assessment & Plan Note (Signed)
Taking loratadine.  No changes.

## 2021-03-10 NOTE — Assessment & Plan Note (Signed)
Appears to be improving.  Very mild.  No changes.

## 2021-03-10 NOTE — Assessment & Plan Note (Signed)
Appears to be General anxiety disorder with some component of social anxiety.  Already on escitalopram.  Compliant.  GAD-7 was normal today as well as PHQ-9.  Continue to follow-up.

## 2021-03-22 DIAGNOSIS — F4310 Post-traumatic stress disorder, unspecified: Secondary | ICD-10-CM | POA: Diagnosis not present

## 2021-03-27 ENCOUNTER — Other Ambulatory Visit (HOSPITAL_COMMUNITY): Payer: Self-pay

## 2021-03-29 ENCOUNTER — Other Ambulatory Visit (HOSPITAL_COMMUNITY): Payer: Self-pay

## 2021-03-29 DIAGNOSIS — F4310 Post-traumatic stress disorder, unspecified: Secondary | ICD-10-CM | POA: Diagnosis not present

## 2021-04-04 ENCOUNTER — Telehealth: Payer: Self-pay | Admitting: Family Medicine

## 2021-04-04 DIAGNOSIS — F4310 Post-traumatic stress disorder, unspecified: Secondary | ICD-10-CM | POA: Diagnosis not present

## 2021-04-04 NOTE — Telephone Encounter (Signed)
[employer] is calling checking on paperwork faxed over about 2 weeks ago. She would like a call back with the status of forms. Please advise. Thanks!  Call back number [phone].

## 2021-04-08 NOTE — Telephone Encounter (Signed)
Is she checking to see if I received them or is there something I'm supposed to do with them? It's about 20 pages of progress notes but I don't see anything requiring my signature.

## 2021-04-10 NOTE — Telephone Encounter (Signed)
Spoke with Cody [name] and she said the forms were for you to review and the request is for you to place a neurology referral so that they can manage pts TBI sypmtoms.  They are beginning to interfere with pts everday life.Shigeo Baugh Zimmerman Rumple, CMA

## 2021-04-12 DIAGNOSIS — F4310 Post-traumatic stress disorder, unspecified: Secondary | ICD-10-CM | POA: Diagnosis not present

## 2021-04-19 DIAGNOSIS — F4310 Post-traumatic stress disorder, unspecified: Secondary | ICD-10-CM | POA: Diagnosis not present

## 2021-04-22 ENCOUNTER — Other Ambulatory Visit: Payer: Self-pay | Admitting: Family Medicine

## 2021-04-25 ENCOUNTER — Telehealth: Payer: Self-pay | Admitting: Family Medicine

## 2021-04-25 ENCOUNTER — Other Ambulatory Visit: Payer: Self-pay | Admitting: Family Medicine

## 2021-04-25 NOTE — Telephone Encounter (Signed)
Can we call this pt to schedule an appointment with me to discuss this referral to neurology for traumatic brain injury.  We didn't discuss this at his initial visit so I need to document what I'm referring him for.

## 2021-04-29 NOTE — Telephone Encounter (Signed)
Called patient and lvm to call back and schedule appointment.  

## 2021-05-03 DIAGNOSIS — F4310 Post-traumatic stress disorder, unspecified: Secondary | ICD-10-CM | POA: Diagnosis not present

## 2021-05-10 DIAGNOSIS — F4310 Post-traumatic stress disorder, unspecified: Secondary | ICD-10-CM | POA: Diagnosis not present

## 2021-05-17 ENCOUNTER — Other Ambulatory Visit (HOSPITAL_COMMUNITY): Payer: Self-pay

## 2021-05-17 DIAGNOSIS — F4310 Post-traumatic stress disorder, unspecified: Secondary | ICD-10-CM | POA: Diagnosis not present

## 2021-05-17 MED FILL — Escitalopram Oxalate Tab 10 MG (Base Equiv): ORAL | 30 days supply | Qty: 30 | Fill #0 | Status: AC

## 2021-05-30 DIAGNOSIS — F4310 Post-traumatic stress disorder, unspecified: Secondary | ICD-10-CM | POA: Diagnosis not present

## 2021-06-04 ENCOUNTER — Ambulatory Visit: Payer: Medicaid Other | Admitting: Family Medicine

## 2021-06-07 DIAGNOSIS — F4310 Post-traumatic stress disorder, unspecified: Secondary | ICD-10-CM | POA: Diagnosis not present

## 2021-06-11 ENCOUNTER — Ambulatory Visit: Payer: Medicaid Other | Admitting: Family Medicine

## 2021-06-13 DIAGNOSIS — F4310 Post-traumatic stress disorder, unspecified: Secondary | ICD-10-CM | POA: Diagnosis not present

## 2021-06-21 DIAGNOSIS — F4310 Post-traumatic stress disorder, unspecified: Secondary | ICD-10-CM | POA: Diagnosis not present

## 2021-06-28 DIAGNOSIS — F4310 Post-traumatic stress disorder, unspecified: Secondary | ICD-10-CM | POA: Diagnosis not present

## 2021-07-11 ENCOUNTER — Ambulatory Visit (INDEPENDENT_AMBULATORY_CARE_PROVIDER_SITE_OTHER): Payer: Medicaid Other | Admitting: Family Medicine

## 2021-07-11 ENCOUNTER — Other Ambulatory Visit: Payer: Self-pay

## 2021-07-11 VITALS — BP 122/66 | HR 76 | Wt 278.0 lb

## 2021-07-11 DIAGNOSIS — K5909 Other constipation: Secondary | ICD-10-CM

## 2021-07-11 MED ORDER — POLYETHYLENE GLYCOL 3350 17 G PO PACK: 17.0000 g | PACK | Freq: Every day | ORAL | 3 refills | Status: AC

## 2021-07-11 MED ORDER — FAMOTIDINE 20 MG PO TABS: 20.0000 mg | ORAL_TABLET | Freq: Two times a day (BID) | ORAL | 3 refills | Status: DC

## 2021-07-11 NOTE — Patient Instructions (Signed)
It was great seeing you today.  For your constipation I want you to try taking MiraLAX daily as needed for this constipation.  You also need to increase the fiber in your diet.  If you have continued issues please follow-up with your PCP.  If you have any questions please call the clinic.  I hope you have a wonderful afternoon!

## 2021-07-11 NOTE — Progress Notes (Signed)
    SUBJECTIVE:   CHIEF COMPLAINT / HPI:   Consitpation  Patient reports chronic history of issues with constipation.  He uses a laxative pill occasionally with good results.  Reports that recently the constipation has been causing his low back to hurt and the pain resolves after defecation.  Reports that his stools are hard.  Reports that he drinks "a lot" of water saying that he drinks a cup when he wakes up, multiple cups throughout the day, and a cup before he goes to bed.  Reports that his diet is mainly junk food as well as pizza.  Reports that he does eat a fair amount of cheese.  Last bowel movement was 1 day ago but he is hoping that he does not have to take a laxative every day.  Denies any vomiting, nausea, dysuria.   OBJECTIVE:   BP 122/66   Pulse 76   Wt 278 lb (126.1 kg)   SpO2 99%   BMI 38.77 kg/m   General: Well-appearing 30 year old male, no acute distress Cardiac: Regular rate and rhythm, no murmurs appreciated Respiratory: Normal work of breathing, lungs clear to auscultation bilaterally Abdomen: Soft, nontender, positive bowel sounds, no costovertebral angle tenderness MSK: No gross abnormalities  ASSESSMENT/PLAN:   Chronic constipation Patient reports longstanding issues of constipation which he takes a laxative pill for occasionally.  Reports adequate p.o. hydration.  Poor diet.  Discussed high-fiber diet and provided handout with high-fiber foods.  Also recommended MiraLAX as needed.  Patient is agreeable to this and will follow-up as needed.  Strict return precautions given.     Gifford Shave, MD [place]

## 2021-07-12 DIAGNOSIS — K5909 Other constipation: Secondary | ICD-10-CM | POA: Insufficient documentation

## 2021-07-12 NOTE — Assessment & Plan Note (Signed)
Patient reports longstanding issues of constipation which he takes a laxative pill for occasionally.  Reports adequate p.o. hydration.  Poor diet.  Discussed high-fiber diet and provided handout with high-fiber foods.  Also recommended MiraLAX as needed.  Patient is agreeable to this and will follow-up as needed.  Strict return precautions given.

## 2021-07-19 DIAGNOSIS — F4310 Post-traumatic stress disorder, unspecified: Secondary | ICD-10-CM | POA: Diagnosis not present

## 2021-07-22 ENCOUNTER — Other Ambulatory Visit (HOSPITAL_COMMUNITY): Payer: Self-pay

## 2021-07-22 MED FILL — Escitalopram Oxalate Tab 10 MG (Base Equiv): ORAL | 30 days supply | Qty: 30 | Fill #0 | Status: AC

## 2021-08-02 DIAGNOSIS — F4310 Post-traumatic stress disorder, unspecified: Secondary | ICD-10-CM | POA: Diagnosis not present

## 2021-08-09 DIAGNOSIS — F4310 Post-traumatic stress disorder, unspecified: Secondary | ICD-10-CM | POA: Diagnosis not present

## 2021-08-15 ENCOUNTER — Other Ambulatory Visit: Payer: Self-pay

## 2021-08-15 ENCOUNTER — Other Ambulatory Visit (HOSPITAL_COMMUNITY): Payer: Self-pay

## 2021-08-16 ENCOUNTER — Other Ambulatory Visit (HOSPITAL_COMMUNITY): Payer: Self-pay

## 2021-08-16 DIAGNOSIS — F4310 Post-traumatic stress disorder, unspecified: Secondary | ICD-10-CM | POA: Diagnosis not present

## 2021-08-16 MED ORDER — PANTOPRAZOLE SODIUM 40 MG PO TBEC
40.0000 mg | DELAYED_RELEASE_TABLET | Freq: Every day | ORAL | 0 refills | Status: DC
  Filled 2021-08-16: qty 30, 30d supply, fill #0

## 2021-08-16 MED ORDER — DEXLANSOPRAZOLE 60 MG PO CPDR
60.0000 mg | DELAYED_RELEASE_CAPSULE | Freq: Every day | ORAL | 0 refills | Status: DC
  Filled 2021-08-16: qty 30, 30d supply, fill #0
  Filled 2021-08-16: qty 90, 90d supply, fill #0

## 2021-08-27 ENCOUNTER — Other Ambulatory Visit (HOSPITAL_COMMUNITY): Payer: Self-pay

## 2021-08-27 MED FILL — Escitalopram Oxalate Tab 10 MG (Base Equiv): ORAL | 30 days supply | Qty: 30 | Fill #1 | Status: AC

## 2021-08-30 DIAGNOSIS — F4310 Post-traumatic stress disorder, unspecified: Secondary | ICD-10-CM | POA: Diagnosis not present

## 2021-09-13 DIAGNOSIS — F4310 Post-traumatic stress disorder, unspecified: Secondary | ICD-10-CM | POA: Diagnosis not present

## 2021-09-17 ENCOUNTER — Other Ambulatory Visit (HOSPITAL_COMMUNITY): Payer: Self-pay

## 2021-09-17 DIAGNOSIS — K5900 Constipation, unspecified: Secondary | ICD-10-CM | POA: Diagnosis not present

## 2021-09-17 DIAGNOSIS — Z79899 Other long term (current) drug therapy: Secondary | ICD-10-CM | POA: Diagnosis not present

## 2021-09-17 DIAGNOSIS — K146 Glossodynia: Secondary | ICD-10-CM | POA: Diagnosis not present

## 2021-09-17 DIAGNOSIS — K219 Gastro-esophageal reflux disease without esophagitis: Secondary | ICD-10-CM | POA: Diagnosis not present

## 2021-09-17 MED ORDER — PANTOPRAZOLE SODIUM 40 MG PO TBEC
40.0000 mg | DELAYED_RELEASE_TABLET | Freq: Every day | ORAL | 12 refills | Status: DC
  Filled 2021-09-17: qty 30, 30d supply, fill #0
  Filled 2021-10-16: qty 30, 30d supply, fill #1
  Filled 2021-11-11: qty 30, 30d supply, fill #2
  Filled 2021-12-06 – 2021-12-20 (×2): qty 30, 30d supply, fill #3
  Filled 2022-01-16: qty 30, 30d supply, fill #4
  Filled 2022-02-11: qty 30, 30d supply, fill #5
  Filled 2022-03-27: qty 30, 30d supply, fill #6
  Filled 2022-04-30: qty 30, 30d supply, fill #7
  Filled 2022-05-29: qty 30, 30d supply, fill #8
  Filled 2022-06-30: qty 30, 30d supply, fill #9
  Filled 2022-07-21: qty 30, 30d supply, fill #10
  Filled 2022-08-20: qty 30, 30d supply, fill #11

## 2021-09-18 ENCOUNTER — Other Ambulatory Visit (HOSPITAL_COMMUNITY): Payer: Self-pay

## 2021-09-18 MED ORDER — ERGOCALCIFEROL 1.25 MG (50000 UT) PO CAPS
1.0000 | ORAL_CAPSULE | ORAL | 3 refills | Status: DC
  Filled 2021-09-18: qty 4, 28d supply, fill #0
  Filled 2021-10-28: qty 4, 28d supply, fill #1
  Filled 2021-12-20: qty 4, 28d supply, fill #2
  Filled 2022-03-03: qty 4, 28d supply, fill #3

## 2021-09-20 ENCOUNTER — Other Ambulatory Visit (HOSPITAL_COMMUNITY): Payer: Self-pay

## 2021-09-20 DIAGNOSIS — F4310 Post-traumatic stress disorder, unspecified: Secondary | ICD-10-CM | POA: Diagnosis not present

## 2021-09-27 DIAGNOSIS — F4310 Post-traumatic stress disorder, unspecified: Secondary | ICD-10-CM | POA: Diagnosis not present

## 2021-10-04 ENCOUNTER — Other Ambulatory Visit: Payer: Self-pay | Admitting: Family Medicine

## 2021-10-04 ENCOUNTER — Other Ambulatory Visit: Payer: Self-pay

## 2021-10-04 ENCOUNTER — Other Ambulatory Visit (HOSPITAL_COMMUNITY): Payer: Self-pay

## 2021-10-04 DIAGNOSIS — F4310 Post-traumatic stress disorder, unspecified: Secondary | ICD-10-CM | POA: Diagnosis not present

## 2021-10-04 MED ORDER — ESCITALOPRAM OXALATE 10 MG PO TABS
10.0000 mg | ORAL_TABLET | Freq: Every evening | ORAL | 5 refills | Status: DC
  Filled 2021-10-04: qty 30, 30d supply, fill #0
  Filled 2021-11-22: qty 30, 30d supply, fill #1
  Filled 2022-01-09: qty 30, 30d supply, fill #2
  Filled 2022-05-07: qty 30, 30d supply, fill #3

## 2021-10-11 DIAGNOSIS — F4310 Post-traumatic stress disorder, unspecified: Secondary | ICD-10-CM | POA: Diagnosis not present

## 2021-10-16 ENCOUNTER — Other Ambulatory Visit (HOSPITAL_COMMUNITY): Payer: Self-pay

## 2021-10-28 ENCOUNTER — Other Ambulatory Visit (HOSPITAL_COMMUNITY): Payer: Self-pay

## 2021-11-08 DIAGNOSIS — F4310 Post-traumatic stress disorder, unspecified: Secondary | ICD-10-CM | POA: Diagnosis not present

## 2021-11-11 ENCOUNTER — Other Ambulatory Visit (HOSPITAL_COMMUNITY): Payer: Self-pay

## 2021-11-22 ENCOUNTER — Other Ambulatory Visit (HOSPITAL_COMMUNITY): Payer: Self-pay

## 2021-11-22 DIAGNOSIS — F4310 Post-traumatic stress disorder, unspecified: Secondary | ICD-10-CM | POA: Diagnosis not present

## 2021-11-29 DIAGNOSIS — F4310 Post-traumatic stress disorder, unspecified: Secondary | ICD-10-CM | POA: Diagnosis not present

## 2021-12-06 ENCOUNTER — Other Ambulatory Visit (HOSPITAL_COMMUNITY): Payer: Self-pay

## 2021-12-17 ENCOUNTER — Other Ambulatory Visit (HOSPITAL_COMMUNITY): Payer: Self-pay

## 2021-12-20 ENCOUNTER — Other Ambulatory Visit (HOSPITAL_COMMUNITY): Payer: Self-pay

## 2021-12-20 DIAGNOSIS — F4310 Post-traumatic stress disorder, unspecified: Secondary | ICD-10-CM | POA: Diagnosis not present

## 2021-12-27 DIAGNOSIS — F4310 Post-traumatic stress disorder, unspecified: Secondary | ICD-10-CM | POA: Diagnosis not present

## 2022-01-09 ENCOUNTER — Other Ambulatory Visit (HOSPITAL_COMMUNITY): Payer: Self-pay

## 2022-01-10 DIAGNOSIS — F4310 Post-traumatic stress disorder, unspecified: Secondary | ICD-10-CM | POA: Diagnosis not present

## 2022-01-16 ENCOUNTER — Other Ambulatory Visit (HOSPITAL_COMMUNITY): Payer: Self-pay

## 2022-01-24 DIAGNOSIS — F4310 Post-traumatic stress disorder, unspecified: Secondary | ICD-10-CM | POA: Diagnosis not present

## 2022-02-11 ENCOUNTER — Other Ambulatory Visit (HOSPITAL_COMMUNITY): Payer: Self-pay

## 2022-02-14 DIAGNOSIS — F4310 Post-traumatic stress disorder, unspecified: Secondary | ICD-10-CM | POA: Diagnosis not present

## 2022-02-27 DIAGNOSIS — F4310 Post-traumatic stress disorder, unspecified: Secondary | ICD-10-CM | POA: Diagnosis not present

## 2022-03-03 ENCOUNTER — Other Ambulatory Visit (HOSPITAL_COMMUNITY): Payer: Self-pay

## 2022-03-18 ENCOUNTER — Ambulatory Visit: Payer: Medicaid Other | Admitting: Family Medicine

## 2022-03-20 ENCOUNTER — Ambulatory Visit (INDEPENDENT_AMBULATORY_CARE_PROVIDER_SITE_OTHER): Payer: Medicaid Other | Admitting: Family Medicine

## 2022-03-20 ENCOUNTER — Encounter: Payer: Self-pay | Admitting: Family Medicine

## 2022-03-20 VITALS — BP 122/78 | HR 66 | Wt 232.6 lb

## 2022-03-20 DIAGNOSIS — G5602 Carpal tunnel syndrome, left upper limb: Secondary | ICD-10-CM | POA: Diagnosis not present

## 2022-03-20 DIAGNOSIS — R200 Anesthesia of skin: Secondary | ICD-10-CM

## 2022-03-20 DIAGNOSIS — R202 Paresthesia of skin: Secondary | ICD-10-CM

## 2022-03-20 NOTE — Patient Instructions (Signed)
For the carpal tunnel I would like for you to purchase a carpal tunnel wrist splint to wear at night.  You do not have to wear this during the day.  I would like for you to do this nightly for 1 month and let me know if it is not improving.  We could always consider an injection at that time. ? ?For the numbness of the left nipple I do not think we need to do anything unless it happens again.  If you develop any back pain, any rash in the area, or any other pains or complaints please not hesitate follow-up with [place]. ?

## 2022-03-20 NOTE — Progress Notes (Signed)
? ? ?  SUBJECTIVE:  ? ?CHIEF COMPLAINT / HPI:  ? ?Left nipple numbness  left index/middle finger numbness: ?Per chart review in 2017 was seen in the ED and mentioned complaint of occasional numbness of the nipple after bench pressing which occurred on the left side at that time as well.  Today he states he had some numbness of his left forearm and then shortly after he had some left nipple numbness. He states he went to the bed and woke up and a few fingers felt numb.  He denies fevers, rash, pain of the left chest.  He states the numbness of his left nipple happened after lifting some heavy bottles and has not recurred since then.  He states he does play a lot of video games and that may be the cause of the left index and middle finger numbness ? ?PERTINENT  PMH / PSH: None relevant ? ?OBJECTIVE:  ? ?Pulse 66   Wt 232 lb 9.6 oz (105.5 kg)   SpO2 98%   BMI 32.44 kg/m?   ? ?General: NAD, pleasant, able to participate in exam ?Respiratory: No respiratory distress ?MSK: Left chest with no rash or overlying injury.  Patient denies any difference in sensation at this time.  Elbow flexor and grip strength 5/5 bilaterally.  Patient does have a positive Tinel's sign on the left, negative on the right.  Negative Spurling's test bilaterally.  Cap refill normal with bilateral fingers. ?Psych: Normal affect and mood ? ?ASSESSMENT/PLAN:  ? ?Carpal tunnel, left: ?Positive Tinel's sign with numbness of the index and middle finger.  He does play frequent videogames which may be a cause.  Discussed starting with night splints which he plans to pick up today.  Recommended follow-up in 4 weeks if not improving and we can consider injections. ? ?Left nipple numbness: ?Seem to occur after lifting some heavy bottles.  It quickly resolved and has not recurred since then.  He has no rash or signs for shingles.  He denies any significant back pain in the region of T4/T5.  Recommend continue to monitor to see if the symptoms recur but  provided reassurance today. ?  ? ? ?Lurline Del, DO ?[place]  ?

## 2022-03-26 ENCOUNTER — Ambulatory Visit: Payer: Medicaid Other | Admitting: Family Medicine

## 2022-03-27 ENCOUNTER — Other Ambulatory Visit (HOSPITAL_COMMUNITY): Payer: Self-pay

## 2022-03-28 DIAGNOSIS — F4310 Post-traumatic stress disorder, unspecified: Secondary | ICD-10-CM | POA: Diagnosis not present

## 2022-03-30 ENCOUNTER — Emergency Department (HOSPITAL_COMMUNITY)
Admission: EM | Admit: 2022-03-30 | Discharge: 2022-03-30 | Disposition: A | Discharge status: Home / Self Care | Payer: Medicaid Other | Attending: Emergency Medicine | Admitting: Emergency Medicine

## 2022-03-30 ENCOUNTER — Emergency Department (HOSPITAL_BASED_OUTPATIENT_CLINIC_OR_DEPARTMENT_OTHER): Payer: Medicaid Other

## 2022-03-30 ENCOUNTER — Other Ambulatory Visit: Payer: Self-pay

## 2022-03-30 ENCOUNTER — Emergency Department (HOSPITAL_COMMUNITY): Payer: Medicaid Other

## 2022-03-30 ENCOUNTER — Encounter (HOSPITAL_COMMUNITY): Payer: Self-pay | Admitting: Emergency Medicine

## 2022-03-30 DIAGNOSIS — M79605 Pain in left leg: Secondary | ICD-10-CM | POA: Diagnosis not present

## 2022-03-30 DIAGNOSIS — N433 Hydrocele, unspecified: Secondary | ICD-10-CM

## 2022-03-30 DIAGNOSIS — N5082 Scrotal pain: Secondary | ICD-10-CM | POA: Diagnosis not present

## 2022-03-30 DIAGNOSIS — R1032 Left lower quadrant pain: Secondary | ICD-10-CM | POA: Diagnosis present

## 2022-03-30 LAB — URINALYSIS, ROUTINE W REFLEX MICROSCOPIC
Bilirubin Urine: NEGATIVE
Glucose, UA: NEGATIVE mg/dL
Hgb urine dipstick: NEGATIVE
Ketones, ur: NEGATIVE mg/dL
Leukocytes,Ua: NEGATIVE
Nitrite: NEGATIVE
Protein, ur: NEGATIVE mg/dL
Specific Gravity, Urine: 1.026 (ref 1.005–1.030)
pH: 6 (ref 5.0–8.0)

## 2022-03-30 MED ORDER — IBUPROFEN 600 MG PO TABS: 600.0000 mg | ORAL_TABLET | Freq: Four times a day (QID) | ORAL | 0 refills | Status: DC | PRN

## 2022-03-30 NOTE — Progress Notes (Signed)
LLE venous duplex has been completed.  Preliminary results given to Dr. Pearline Cables via secure chat. ? ? ?Results can be found under chart review under CV PROC. ?03/30/2022 2:23 PM ?Onita Pfluger RVT, RDMS ? ?

## 2022-03-30 NOTE — ED Provider Notes (Signed)
?[facility] ?Provider Note ? ? ?CSN: [id number] ?Arrival date & time: 03/30/22  1229 ? ?  ? ?History ? ?Chief Complaint  ?Patient presents with  ? Abdominal Pain  ? ? ?Kiet Geer is a 31 y.o. male. ? ?Patient is a 31 year old male presenting for left-sided groin pain that radiates to the left testicles and left leg.  Denies any genital rashes, lesions, discharge, or dysuria.  Denies sexual activity.  Denies abdominal pain, nausea, or vomiting.  States the leg pain radiates from the left upper thigh to the left knee.  No recent falls or injuries.  Concern for blood clot due to family history of blood clotting disorders. ? ?The history is provided by the patient. No language interpreter was used.  ?Abdominal Pain ?Associated symptoms: no chest pain, no chills, no cough, no dysuria, no fever, no hematuria, no nausea, no shortness of breath, no sore throat and no vomiting   ? ?  ? ?Home Medications ?Prior to Admission medications   ?Medication Sig Start Date End Date Taking? Authorizing Provider  ?ibuprofen (ADVIL) 600 MG tablet Take 1 tablet (600 mg total) by mouth every 6 (six) hours as needed. 12/23/22  Yes Campbell Stall P, DO  ?acetaminophen (TYLENOL) 500 MG tablet Take 500 mg by mouth every 6 (six) hours as needed for mild pain.    [provider]  ?DEXILANT 60 MG capsule TAKE 1 CAPSULE BY MOUTH EVERY DAY 10/11/20 10/11/21  Nolene Ebbs, MD  ?dexlansoprazole (DEXILANT) 60 MG capsule TAKE 1 CAPSULE BY MOUTH EVERY DAY 10/15/20 10/15/21  Arta Silence, MD  ?dexlansoprazole (DEXILANT) 60 MG capsule Take 1 capsule (60 mg total) by mouth daily. 08/16/21     ?ergocalciferol (VITAMIN D2) 1.25 MG (50000 UT) capsule Take 1 capsule (50,000 Units total) by mouth once a week. 09/18/21     ?escitalopram (LEXAPRO) 10 MG tablet Take 1 tablet (10 mg total) by mouth every evening. 10/04/21   [name], MD  ?famotidine (PEPCID) 20 MG tablet Take 1 tablet (20 mg total) by mouth 2 (two)  times daily. 07/11/21   Gifford Shave, MD  ?omeprazole (PRILOSEC) 20 MG capsule Take 1 capsule (20 mg total) by mouth daily. ?Patient taking differently: Take 20 mg by mouth daily as needed (reflux).  11/14/16   Waynetta Pean, PA-C  ?pantoprazole (PROTONIX) 40 MG tablet Take 1 tablet (40 mg total) by mouth daily. 09/17/21     ?polyethylene glycol (MIRALAX) 17 g packet Take 17 g by mouth daily. 07/11/21   Gifford Shave, MD  ?PROAIR HFA 108 [id number] Base) MCG/ACT inhaler INHALE 2 PUFFS INTO THE LUNGS EVERY 4 (FOUR) HOURS AS NEEDED FOR UP TO 30 DAYS FOR SHORTNESS OF BREATH. 01/23/21 01/23/22  Cheek, Mal Misty, PA-C  ?Vitamin D, Ergocalciferol, (DRISDOL) 1.25 MG (50000 UNIT) CAPS capsule Take 50,000 Units by mouth once a week. 03/13/20   [provider]  ?fluticasone (FLONASE) 50 MCG/ACT nasal spray Place 2 sprays into both nostrils daily. ?Patient not taking: Reported on 10/06/2018 06/30/18 03/20/20  Couture, Cortni S, PA-C  ?triamcinolone (NASACORT AQ) 55 MCG/ACT AERO nasal inhaler Place 2 sprays into the nose daily. ?Patient not taking: Reported on 01/13/2018 11/14/16 03/20/20  Waynetta Pean, PA-C  ?   ? ?Allergies    ?Penicillins and Zyrtec [cetirizine]   ? ?Review of Systems   ?Review of Systems  ?Constitutional:  Negative for chills and fever.  ?HENT:  Negative for ear pain and sore throat.   ?Eyes:  Negative for  pain and visual disturbance.  ?Respiratory:  Negative for cough and shortness of breath.   ?Cardiovascular:  Negative for chest pain and palpitations.  ?Gastrointestinal:  Negative for abdominal pain, nausea and vomiting.  ?Genitourinary:  Positive for testicular pain. Negative for difficulty urinating, dysuria, enuresis, flank pain, frequency, hematuria, penile discharge and penile pain.  ?Musculoskeletal:  Negative for arthralgias and back pain.  ?Skin:  Negative for color change and rash.  ?Neurological:  Negative for seizures and syncope.  ?All other systems reviewed and are negative. ? ?Physical  Exam ?Updated Vital Signs ?BP 133/65   Pulse 67   Temp 98.2 ?F (36.8 ?C) (Oral)   Resp 17   SpO2 100%  ?Physical Exam ?Vitals and nursing note reviewed. Exam conducted with a chaperone present.  ?Constitutional:   ?   General: He is not in acute distress. ?   Appearance: He is well-developed.  ?HENT:  ?   Head: Normocephalic and atraumatic.  ?Eyes:  ?   Conjunctiva/sclera: Conjunctivae normal.  ?Cardiovascular:  ?   Rate and Rhythm: Normal rate and regular rhythm.  ?   Heart sounds: No murmur heard. ?Pulmonary:  ?   Effort: Pulmonary effort is normal. No respiratory distress.  ?   Breath sounds: Normal breath sounds.  ?Abdominal:  ?   Palpations: Abdomen is soft.  ?   Tenderness: There is no abdominal tenderness.  ?Genitourinary: ?   Penis: Normal and circumcised.   ?   Testes:     ?   Left: Tenderness present.  ?Musculoskeletal:     ?   General: No swelling.  ?   Cervical back: Neck supple.  ?Skin: ?   General: Skin is warm and dry.  ?   Capillary Refill: Capillary refill takes less than 2 seconds.  ?Neurological:  ?   Mental Status: He is alert.  ?Psychiatric:     ?   Mood and Affect: Mood normal.  ? ? ?ED Results / Procedures / Treatments   ?Labs ?(all labs ordered are listed, but only abnormal results are displayed) ?Labs Reviewed  ?URINALYSIS, ROUTINE W REFLEX MICROSCOPIC  ?GC/CHLAMYDIA PROBE AMP () NOT AT [facility]  ? ? ?EKG ?None ? ?Radiology ?US Scrotum ? ?Result Date: 03/30/2022 ?CLINICAL DATA:  Left scrotal pain. EXAM: ULTRASOUND OF SCROTUM TECHNIQUE: Complete ultrasound examination of the testicles, epididymis, and other scrotal structures was performed. COMPARISON:  None. FINDINGS: Right testicle Measurements: 4.8 x 2.1 x 2.4 cm. No mass or microlithiasis visualized. Normal color Doppler blood flow. Left testicle Measurements: 4.2 x 1.9 x 2.8 cm. No mass or microlithiasis visualized. Normal color Doppler blood flow. Right epididymis:  Normal in size and appearance. Left epididymis:  Normal in  size and appearance. Hydrocele:  Trace left hydrocele. Varicocele:  None visualized. IMPRESSION: 1. No acute findings. No findings to account for scrotal pain. Trace left hydrocele. Otherwise normal. Electronically Signed   By: Lajean Manes M.D.   On: 03/30/2022 14:08  ? ?VAS [place] LOWER EXTREMITY VENOUS (DVT) (ONLY [facility] & [facility]) ? ?Result Date: 03/30/2022 ? Lower Venous DVT Study Patient Name:  TORIN MODICA  Date of Exam:   03/30/2022 Medical Rec #: [id number]     Accession #:    [id number] Date of Birth: October 23, 1991      Patient Gender: M Patient Age:   76 years Exam Location:  [facility] Procedure:      VAS [place] LOWER EXTREMITY VENOUS (DVT) Referring Phys: Campbell Stall --------------------------------------------------------------------------------  Indications: Pain.  Comparison Study: No previous exams Performing Technologist: Jody Hill RVT, RDMS  Examination Guidelines: A complete evaluation includes B-mode imaging, spectral Doppler, color Doppler, and power Doppler as needed of all accessible portions of each vessel. Bilateral testing is considered an integral part of a complete examination. Limited examinations for reoccurring indications may be performed as noted. The reflux portion of the exam is performed with the patient in reverse Trendelenburg.  +-----+---------------+---------+-----------+----------+--------------+ RIGHTCompressibilityPhasicitySpontaneityPropertiesThrombus Aging +-----+---------------+---------+-----------+----------+--------------+ CFV  Full           Yes      Yes                                 +-----+---------------+---------+-----------+----------+--------------+   +---------+---------------+---------+-----------+----------+--------------+ LEFT     CompressibilityPhasicitySpontaneityPropertiesThrombus Aging +---------+---------------+---------+-----------+----------+--------------+ CFV      Full           Yes      Yes                                  +---------+---------------+---------+-----------+----------+--------------+ SFJ      Full                                                        +---------+---------------+---------+-----------+----------+--------------+ FV Prox  Full           Yes

## 2022-03-30 NOTE — ED Triage Notes (Signed)
LLQ pain that radiates to groin and down L leg since yesterday.  Denies nausea, vomiting, diarrhea, and urinary complaints. ?

## 2022-03-30 NOTE — ED Notes (Signed)
Pt verbalizes understanding of discharge instructions. Opportunity for questions and answers were provided. Pt discharged from the ED.   ?

## 2022-03-30 NOTE — ED Notes (Signed)
Patient transported to Ultrasound 

## 2022-04-04 DIAGNOSIS — F4310 Post-traumatic stress disorder, unspecified: Secondary | ICD-10-CM | POA: Diagnosis not present

## 2022-04-11 DIAGNOSIS — F4310 Post-traumatic stress disorder, unspecified: Secondary | ICD-10-CM | POA: Diagnosis not present

## 2022-04-18 DIAGNOSIS — F4310 Post-traumatic stress disorder, unspecified: Secondary | ICD-10-CM | POA: Diagnosis not present

## 2022-04-30 ENCOUNTER — Other Ambulatory Visit (HOSPITAL_COMMUNITY): Payer: Self-pay

## 2022-05-02 ENCOUNTER — Other Ambulatory Visit (HOSPITAL_COMMUNITY): Payer: Self-pay

## 2022-05-02 DIAGNOSIS — F4310 Post-traumatic stress disorder, unspecified: Secondary | ICD-10-CM | POA: Diagnosis not present

## 2022-05-06 NOTE — Progress Notes (Signed)
    SUBJECTIVE:   CHIEF COMPLAINT / HPI:  Chief Complaint  Patient presents with   Recurrent Sinusitis   Rash    On back of scalp    Patient has had ongoing nasal congestion, rhinorrhea, left ear discomfort, and pain around the back of his head for the past few weeks. Denies fever, cough, sore throat. Not using any medications for this  Also has had itchiness and dryness on the back of his scalp on the left for years. Previously tried ketoconazole shampoo which had helped some but never resolved the issue. Not using any hair products currently. He dyed his hair a few months ago. Washes his hair every other day with Head and Shoulders.  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 Has some memory issues which did not manifest until later in life. On disabilities.  PERTINENT  PMH / PSH: asthma, GERD, constipation, anxiety, allergic rhinitis, TBI  Patient Care Team: [name], MD as PCP - General (Family Medicine)   OBJECTIVE:   BP 124/78   Pulse 61   Ht '5\' 11"'$  (1.803 m)   Wt 229 lb (103.9 kg)   SpO2 99%   BMI 31.94 kg/m   Physical Exam Constitutional:      General: He is not in acute distress. HENT:     Head:     Comments: No maxillary or frontal sinus tenderness    Right Ear: Tympanic membrane normal.     Left Ear: Tympanic membrane is retracted.     Nose: Congestion present.     Comments: Enlarged turbinates Cardiovascular:     Rate and Rhythm: Normal rate and regular rhythm.  Pulmonary:     Effort: Pulmonary effort is normal. No respiratory distress.     Breath sounds: Normal breath sounds.  Abdominal:     Palpations: Abdomen is soft.     Tenderness: There is no abdominal tenderness.  Musculoskeletal:     Cervical back: Neck supple.  Skin:    Comments: Scalp without any notable abnormalities - no rash, scaling, alopecia noted  Neurological:     Mental Status: He is alert.        05/07/2022    1:44 PM  Depression screen PHQ 2/9   Decreased Interest 0  Down, Depressed, Hopeless 0  PHQ - 2 Score 0     {Show previous vital signs (optional):23777}    ASSESSMENT/PLAN:   Seasonal allergies Symptoms consistent with allergic rhinitis, low suspicion for bacterial sinusitis. Left TM retraction causing discomfort likely related to congestion. - loratadine - Flonase refilled  Itchy scalp Chronic problem. No abnormalities on exam. Previously tried ketoconazole shampoo with no relief. - try [place] baby shampoo - continue moisturizing conditioner - avoid hair products    Return in about 4 weeks (around 06/04/2022) for f/u scalp irritation.   [name], MD [place]

## 2022-05-06 NOTE — Patient Instructions (Addendum)
It was nice seeing you today! ? ?Take Claritin and Flonase for allergies and congestion. ? ?Try [place] baby shampoo. You can also try using a moisturizing conditioner. Avoid hair products. ? ?Stay well, ?[name], MD ?[place] ?[phone] ? ?-- ? ?Make sure to check out at the front desk before you leave today. ? ?Please arrive at least 15 minutes prior to your scheduled appointments. ? ?If you had blood work today, I will send you a MyChart message or a letter if results are normal. Otherwise, I will give you a call. ? ?If you had a referral placed, they will call you to set up an appointment. Please give [place] a call if you don't hear back in the next 2 weeks. ? ?If you need additional refills before your next appointment, please call your pharmacy first.  ?

## 2022-05-07 ENCOUNTER — Ambulatory Visit (INDEPENDENT_AMBULATORY_CARE_PROVIDER_SITE_OTHER): Payer: Medicaid Other | Admitting: Family Medicine

## 2022-05-07 ENCOUNTER — Encounter: Payer: Self-pay | Admitting: Family Medicine

## 2022-05-07 ENCOUNTER — Other Ambulatory Visit (HOSPITAL_COMMUNITY): Payer: Self-pay

## 2022-05-07 VITALS — BP 124/78 | HR 61 | Ht 71.0 in | Wt 229.0 lb

## 2022-05-07 DIAGNOSIS — S069XAA Unspecified intracranial injury with loss of consciousness status unknown, initial encounter: Secondary | ICD-10-CM | POA: Insufficient documentation

## 2022-05-07 DIAGNOSIS — J302 Other seasonal allergic rhinitis: Secondary | ICD-10-CM | POA: Diagnosis present

## 2022-05-07 DIAGNOSIS — S069XAS Unspecified intracranial injury with loss of consciousness status unknown, sequela: Secondary | ICD-10-CM

## 2022-05-07 DIAGNOSIS — L299 Pruritus, unspecified: Secondary | ICD-10-CM | POA: Diagnosis not present

## 2022-05-07 MED ORDER — FLUTICASONE PROPIONATE 50 MCG/ACT NA SUSP
2.0000 | Freq: Every day | NASAL | 0 refills | Status: DC
  Filled 2022-05-07 – 2022-06-18 (×2): qty 16, 30d supply, fill #0

## 2022-05-07 MED ORDER — LORATADINE 10 MG PO TABS
10.0000 mg | ORAL_TABLET | Freq: Every day | ORAL | 11 refills | Status: DC
  Filled 2022-05-07 – 2022-05-22 (×2): qty 30, 30d supply, fill #0
  Filled 2022-07-21: qty 30, 30d supply, fill #1

## 2022-05-07 NOTE — Assessment & Plan Note (Signed)
Chronic problem. No abnormalities on exam. Previously tried ketoconazole shampoo with no relief. ?- try [employer] baby shampoo ?- continue moisturizing conditioner ?- avoid hair products ?

## 2022-05-07 NOTE — Assessment & Plan Note (Signed)
Symptoms consistent with allergic rhinitis, low suspicion for bacterial sinusitis. Left TM retraction causing discomfort likely related to congestion. ?- loratadine ?- Flonase refilled ?

## 2022-05-09 DIAGNOSIS — F4310 Post-traumatic stress disorder, unspecified: Secondary | ICD-10-CM | POA: Diagnosis not present

## 2022-05-16 DIAGNOSIS — F4310 Post-traumatic stress disorder, unspecified: Secondary | ICD-10-CM | POA: Diagnosis not present

## 2022-05-22 ENCOUNTER — Other Ambulatory Visit (HOSPITAL_COMMUNITY): Payer: Self-pay

## 2022-05-23 DIAGNOSIS — F4310 Post-traumatic stress disorder, unspecified: Secondary | ICD-10-CM | POA: Diagnosis not present

## 2022-05-27 ENCOUNTER — Encounter: Payer: Self-pay | Admitting: *Deleted

## 2022-05-29 ENCOUNTER — Other Ambulatory Visit (HOSPITAL_COMMUNITY): Payer: Self-pay

## 2022-05-30 DIAGNOSIS — F4310 Post-traumatic stress disorder, unspecified: Secondary | ICD-10-CM | POA: Diagnosis not present

## 2022-06-02 ENCOUNTER — Other Ambulatory Visit (HOSPITAL_COMMUNITY): Payer: Self-pay

## 2022-06-06 DIAGNOSIS — F4310 Post-traumatic stress disorder, unspecified: Secondary | ICD-10-CM | POA: Diagnosis not present

## 2022-06-13 DIAGNOSIS — F4310 Post-traumatic stress disorder, unspecified: Secondary | ICD-10-CM | POA: Diagnosis not present

## 2022-06-18 ENCOUNTER — Other Ambulatory Visit (HOSPITAL_COMMUNITY): Payer: Self-pay

## 2022-06-20 DIAGNOSIS — F4310 Post-traumatic stress disorder, unspecified: Secondary | ICD-10-CM | POA: Diagnosis not present

## 2022-06-26 NOTE — Patient Instructions (Addendum)
It was nice seeing you today!  Take doxycycline twice a day for 7 days.  Take Flonase in addition to your allergy pills for congestion.  Make sure to see your dentist.  Referral placed for dermatology.  Stay well, [name], MD [place] [phone]  --  Make sure to check out at the front desk before you leave today.  Please arrive at least 15 minutes prior to your scheduled appointments.  If you had blood work today, I will send you a MyChart message or a letter if results are normal. Otherwise, I will give you a call.  If you had a referral placed, they will call you to set up an appointment. Please give [place] a call if you don't hear back in the next 2 weeks.  If you need additional refills before your next appointment, please call your pharmacy first.

## 2022-06-26 NOTE — Progress Notes (Signed)
SUBJECTIVE:   CHIEF COMPLAINT / HPI:  Chief Complaint  Patient presents with   dry skin on back of head    Request referral to dermatologist    Sinusitis    Having facial pain    Patient has had ongoing issues with an area of dryness/itchiness on his scalp for several years.  He feels there is a hardened/thickened area on his scalp.  He is requesting referral to dermatology.  I recommended he try baby shampoo last visit which she states did not help.  He has also tried ketoconazole shampoo, [place] Blue, and head and shoulders shampoo which have not provided long-term relief.  He tries to only wash his hair every few days.  Patient reports he is still having significant congestion and ear discomfort.  He thinks he has a sinus infection.  He tried the loratadine which does help but only provide sleep for a few hours.  He has not tried the [facility] which I recommended last visit.  Denies fever and chills.  Patient reports he developed severe dental pain yesterday to the right upper part of his jaw.  He had difficulty sleeping last night due to pain.  He thinks he has a dental infection.  He has an appointment next Monday with his dentist.  Denies drainage.   PERTINENT  PMH / PSH: TBI, GERD, constipation, asthma, allergic rhinitis  Patient Care Team: [name], MD as PCP - General (Family Medicine)   OBJECTIVE:   BP 110/70   Pulse (!) 58   Ht '5\' 11"'$  (1.803 m)   Wt 225 lb (102.1 kg)   SpO2 98%   BMI 31.38 kg/m   Physical Exam Constitutional:      General: He is not in acute distress. HENT:     Head:     Comments: No frontal or maxillary sinus tenderness    Mouth/Throat:     Comments: No obvious erythema or drainage visualized in the upper and lower jaw. Cardiovascular:     Rate and Rhythm: Normal rate and regular rhythm.  Pulmonary:     Effort: Pulmonary effort is normal. No respiratory distress.     Breath sounds: Normal breath sounds.  Musculoskeletal:     Cervical  back: Neck supple.  Skin:    General: Skin is warm and dry.     Comments: Left posterior scalp there is a hyperpigmented, slightly raised flat papule measuring approximately 0.7 cm.  Otherwise, no obvious abnormalities visualized on the scalp.  Neurological:     Mental Status: He is alert.         06/27/2022    8:41 AM  Depression screen PHQ 2/9  Decreased Interest 2  Down, Depressed, Hopeless 2  PHQ - 2 Score 4  Altered sleeping 3  Tired, decreased energy 3  Change in appetite 0  Feeling bad or failure about yourself  2  Trouble concentrating 2  Moving slowly or fidgety/restless 2  Suicidal thoughts 0  PHQ-9 Score 16     {Show previous vital signs (optional):23777}    ASSESSMENT/PLAN:   Itchy scalp Hyperpigmented firm area on scalp possibly related to chronic scratching.  He has tried multiple therapies including antifungal shampoo without relief. - dermatology referral per patient request   Seasonal allergies Still suspect primarily allergic rhinitis though reasonable to treat for bacterial sinusitis given length of symptoms and possible dental infection though no obvious infection or abscess seen on exam. - continue loratadine - advised to use Flonase -  doxycycline 100 mg BID x 7d    Return in about 2 months (around 08/28/2022) for f/u allergies.   [name], MD [place]

## 2022-06-27 ENCOUNTER — Encounter: Payer: Self-pay | Admitting: Family Medicine

## 2022-06-27 ENCOUNTER — Other Ambulatory Visit (HOSPITAL_COMMUNITY): Payer: Self-pay

## 2022-06-27 ENCOUNTER — Ambulatory Visit (INDEPENDENT_AMBULATORY_CARE_PROVIDER_SITE_OTHER): Payer: Medicaid Other | Admitting: Family Medicine

## 2022-06-27 VITALS — BP 110/70 | HR 58 | Ht 71.0 in | Wt 225.0 lb

## 2022-06-27 DIAGNOSIS — F4310 Post-traumatic stress disorder, unspecified: Secondary | ICD-10-CM | POA: Diagnosis not present

## 2022-06-27 DIAGNOSIS — L299 Pruritus, unspecified: Secondary | ICD-10-CM

## 2022-06-27 DIAGNOSIS — K0889 Other specified disorders of teeth and supporting structures: Secondary | ICD-10-CM | POA: Diagnosis not present

## 2022-06-27 DIAGNOSIS — J019 Acute sinusitis, unspecified: Secondary | ICD-10-CM | POA: Diagnosis present

## 2022-06-27 DIAGNOSIS — J302 Other seasonal allergic rhinitis: Secondary | ICD-10-CM

## 2022-06-27 MED ORDER — DOXYCYCLINE HYCLATE 100 MG PO TABS
100.0000 mg | ORAL_TABLET | Freq: Two times a day (BID) | ORAL | 0 refills | Status: AC
  Filled 2022-06-27: qty 14, 7d supply, fill #0

## 2022-06-27 NOTE — Assessment & Plan Note (Signed)
Still suspect primarily allergic rhinitis though reasonable to treat for bacterial sinusitis given length of symptoms and possible dental infection though no obvious infection or abscess seen on exam. - continue loratadine - advised to use Flonase - doxycycline 100 mg BID x 7d

## 2022-06-27 NOTE — Assessment & Plan Note (Signed)
Hyperpigmented firm area on scalp possibly related to chronic scratching.  He has tried multiple therapies including antifungal shampoo without relief. - dermatology referral per patient request

## 2022-06-30 ENCOUNTER — Other Ambulatory Visit (HOSPITAL_COMMUNITY): Payer: Self-pay

## 2022-07-04 DIAGNOSIS — F4310 Post-traumatic stress disorder, unspecified: Secondary | ICD-10-CM | POA: Diagnosis not present

## 2022-07-11 DIAGNOSIS — F4310 Post-traumatic stress disorder, unspecified: Secondary | ICD-10-CM | POA: Diagnosis not present

## 2022-07-17 ENCOUNTER — Other Ambulatory Visit (HOSPITAL_COMMUNITY): Payer: Self-pay

## 2022-07-17 ENCOUNTER — Ambulatory Visit (INDEPENDENT_AMBULATORY_CARE_PROVIDER_SITE_OTHER): Payer: Medicaid Other | Admitting: Family Medicine

## 2022-07-17 ENCOUNTER — Encounter: Payer: Self-pay | Admitting: Family Medicine

## 2022-07-17 DIAGNOSIS — Z833 Family history of diabetes mellitus: Secondary | ICD-10-CM | POA: Insufficient documentation

## 2022-07-17 DIAGNOSIS — R21 Rash and other nonspecific skin eruption: Secondary | ICD-10-CM | POA: Insufficient documentation

## 2022-07-17 LAB — POCT GLYCOSYLATED HEMOGLOBIN (HGB A1C): Hemoglobin A1C: 5.4 % (ref 4.0–5.6)

## 2022-07-17 MED ORDER — GABAPENTIN 100 MG PO CAPS
100.0000 mg | ORAL_CAPSULE | Freq: Two times a day (BID) | ORAL | 3 refills | Status: DC
  Filled 2022-07-17: qty 60, 30d supply, fill #0

## 2022-07-17 MED ORDER — TRIAMCINOLONE ACETONIDE 0.1 % EX CREA
1.0000 | TOPICAL_CREAM | Freq: Two times a day (BID) | CUTANEOUS | 1 refills | Status: DC
  Filled 2022-07-17: qty 80, 28d supply, fill #0

## 2022-07-17 NOTE — Assessment & Plan Note (Signed)
Not so much rash as hypersensitivity.  A1C is neg (likely not acanthosis nigricans.)  I believe I am treating a symptom, not an important medical condition.  Will approach stepwise with first low dose gabapentin and then add topical steroid.

## 2022-07-17 NOTE — Patient Instructions (Signed)
Start the pills first for one week before starting the cream.  I want you to be able to tell be if one or both of them work See [place] in 3-4 weeks to tell us which is helping and we can make adjustments.

## 2022-07-17 NOTE — Progress Notes (Signed)
    SUBJECTIVE:   CHIEF COMPLAINT / HPI:   Neck hypersensitivity.  Patient has longstanding issues with neck being sensitive and easily irritated.  He may have a rash at times, but most times this neck sensitivity is not accompanied by rash.  He can only tolerate wearing V-necked teeshirts because anything tighter he finds intolerable.  Present x years.  Worsening.  Scratches some but not excessively.  Does not recall exactly how and when this began  Has anxiety and feels this is reasonably well controled by lexaprol.  + FHx of DM   OBJECTIVE:   BP 121/65   Pulse (!) 56   Ht '5\' 11"'$  (1.803 m)   Wt 221 lb 6.4 oz (100.4 kg)   SpO2 100%   BMI 30.88 kg/m   Does have some mild darkening of thickening of the posterior half of the neck bilaterally (similar to acanthosis nigricans) No other rash No anesthesia or other hypersensitivity.  ASSESSMENT/PLAN:   Rash of neck Not so much rash as hypersensitivity.  A1C is neg (likely not acanthosis nigricans.)  I believe I am treating a symptom, not an important medical condition.  Will approach stepwise with first low dose gabapentin and then add topical steroid.       Zenia Resides, MD [place]

## 2022-07-18 ENCOUNTER — Emergency Department (HOSPITAL_COMMUNITY): Payer: Medicaid Other

## 2022-07-18 ENCOUNTER — Encounter (HOSPITAL_COMMUNITY): Payer: Self-pay | Admitting: Emergency Medicine

## 2022-07-18 ENCOUNTER — Emergency Department (HOSPITAL_COMMUNITY)
Admission: EM | Admit: 2022-07-18 | Discharge: 2022-07-18 | Disposition: A | Discharge status: Home / Self Care | Payer: Medicaid Other | Attending: Emergency Medicine | Admitting: Emergency Medicine

## 2022-07-18 ENCOUNTER — Other Ambulatory Visit: Payer: Self-pay

## 2022-07-18 DIAGNOSIS — R739 Hyperglycemia, unspecified: Secondary | ICD-10-CM | POA: Insufficient documentation

## 2022-07-18 DIAGNOSIS — M7712 Lateral epicondylitis, left elbow: Secondary | ICD-10-CM | POA: Diagnosis not present

## 2022-07-18 DIAGNOSIS — M7702 Medial epicondylitis, left elbow: Secondary | ICD-10-CM | POA: Insufficient documentation

## 2022-07-18 DIAGNOSIS — R079 Chest pain, unspecified: Secondary | ICD-10-CM | POA: Diagnosis not present

## 2022-07-18 DIAGNOSIS — R200 Anesthesia of skin: Secondary | ICD-10-CM | POA: Diagnosis not present

## 2022-07-18 DIAGNOSIS — R001 Bradycardia, unspecified: Secondary | ICD-10-CM | POA: Diagnosis not present

## 2022-07-18 DIAGNOSIS — H5010 Unspecified exotropia: Secondary | ICD-10-CM | POA: Insufficient documentation

## 2022-07-18 DIAGNOSIS — R0789 Other chest pain: Secondary | ICD-10-CM | POA: Diagnosis not present

## 2022-07-18 LAB — BASIC METABOLIC PANEL
Anion gap: 7 (ref 5–15)
BUN: 13 mg/dL (ref 6–20)
CO2: 28 mmol/L (ref 22–32)
Calcium: 9.4 mg/dL (ref 8.9–10.3)
Chloride: 106 mmol/L (ref 98–111)
Creatinine, Ser: 1.45 mg/dL — ABNORMAL HIGH (ref 0.61–1.24)
GFR, Estimated: >60 mL/min (ref >60)
Glucose, Bld: 102 mg/dL — ABNORMAL HIGH (ref 70–99)
Potassium: 3.7 mmol/L (ref 3.5–5.1)
Sodium: 141 mmol/L (ref 135–145)

## 2022-07-18 LAB — TROPONIN I (HIGH SENSITIVITY)
Troponin I (High Sensitivity): 4 ng/L (ref <18)
Troponin I (High Sensitivity): 4 ng/L (ref <18)

## 2022-07-18 LAB — CBC
HCT: 46.8 % (ref 39.0–52.0)
Hemoglobin: 15.7 g/dL (ref 13.0–17.0)
MCH: 30.7 pg (ref 26.0–34.0)
MCHC: 33.5 g/dL (ref 30.0–36.0)
MCV: 91.4 fL (ref 80.0–100.0)
Platelets: 320 10*3/uL (ref 150–400)
RBC: 5.12 MIL/uL (ref 4.22–5.81)
RDW: 13.1 % (ref 11.5–15.5)
WBC: 4.8 10*3/uL (ref 4.0–10.5)
nRBC: 0 % (ref 0.0–0.2)

## 2022-07-18 MED ORDER — LIDOCAINE 5 % EX PTCH
1.0000 | MEDICATED_PATCH | CUTANEOUS | Status: DC
  Administered 2022-07-18: 1 via TRANSDERMAL
  Filled 2022-07-18: qty 1

## 2022-07-18 MED ORDER — LIDOCAINE 4 % EX PTCH: 1.0000 | MEDICATED_PATCH | CUTANEOUS | 0 refills | Status: AC

## 2022-07-18 NOTE — ED Provider Triage Note (Signed)
Emergency Medicine Provider Triage Evaluation Note  Cody [name] , a 31 y.o. male  was evaluated in triage.  Pt complains of left lower forearm decreased sensation intermittently over the past few days since resuming his Lexapro 10 mg dose for his anxiety.  Patient reports that he had some intermittent chest pain and shortness of breath earlier today as well but is now resolved.  Denies any headache or any blurry vision.  Reports diffuse weakness, not focal.  Patient was recently seen by his PCP yesterday and was placed on gabapentin.  He reports that he felt like his shirts are really sensitive around his neck and felt they were choking him so they placed him on 100 mg of gabapentin.  Review of Systems  Positive:  Negative:   Physical Exam  BP 139/83 (BP Location: Right Arm)   Pulse 66   Temp 98.4 F (36.9 C) (Oral)   Resp 18   SpO2 98%  Gen:   Awake, no distress   Resp:  Normal effort  MSK:   Moves extremities without difficulty  Other:  Pulses intact.  Subjective sensory deficit to the left upper extremity.  No pronator drift.  Compartments are soft.  Cranial nerves II through XII intact, however the patient does have exotropia on the left is at his baseline.  Ambulatory with ease.  Medical Decision Making  Medically screening exam initiated at 10:27 AM.  Appropriate orders placed.  Cody [name] was informed that the remainder of the evaluation will be completed by another provider, this initial triage assessment does not replace that evaluation, and the importance of remaining in the ED until their evaluation is complete.  We will order chest pain work-up.  Doubt stroke at this time.  Out of any window given that the symptoms been going off and on for the past few days.   Cody Puller, PA-C 07/18/22 1030

## 2022-07-18 NOTE — Discharge Instructions (Addendum)
You were seen here today for evaluation of your left upper arm numbness as well as your chest pain and shortness of breath today.  Your labs were unremarkable.  So is your imaging.  I likely think this is something called medial epicondylitis.  I have included more information on this paperwork.  Please read.  I have sent you a referral to an orthopedic provider.  I have sent in some lidocaine patches to help with this as you were given today.  If your insurance does not cover this, you can pick them up over-the-counter as they are relatively the same thing.  If you are having continued chest pain or shortness of breath, please return to the nearest emergency department for reevaluation.  Otherwise, please try not to rest your elbow and sleep with your arm extended.  Again, you will need to call orthopedic provider to schedule an appointment.  If you have any concern, new or worsening symptoms, please return the nearest emergency department for evaluation.  Contact a health care provider if: Your pain does not improve or it gets worse. You notice numbness in your hand. Get help right away if: Your pain is severe. You cannot move your wrist.

## 2022-07-18 NOTE — ED Triage Notes (Signed)
Patient here with complaint of left forearm numbness that started a few days ago after resuming his anxiety medication. No facial droop, no dysarthria, no vision changes. Patient is alert, oriented, ambulatory, and in no apparent distress a this time.

## 2022-07-18 NOTE — ED Provider Notes (Cosign Needed Addendum)
[facility] EMERGENCY DEPARTMENT Provider Note   CSN: [id number] Arrival date & time: 07/18/22  1017     History Chief Complaint  Patient presents with   Numbness    Cody [name] is a 31 y.o. male with history of anxiety presents to the emergency department for evaluation of numbness to his lower left arm.  Patient reports that this numbness is intermittent and it started after he took his Lexapro.  He reports it is worse whenever he rested on services and is relieved whenever he lifts his arm up.  He reports he has been stressed recently.  He also reports that he has not chest pain or shortness of breath momentarily this morning.  He was recently seen by his primary care doctor yesterday due to sensitivity around his neck.  Patient reports that he cannot work normal seizures as he feels like the neck is too tight and he feels like he is being choked.  He was given gabapentin 100 mg for this.  Patient did not pick up this medication.  He denies any headache, blurry vision, palpitations, neck pain, trauma to the area or any recent injury.  Denies any unilateral weakness.  Patient reports he has a history of carpal tunnel in his left hand as well.  HPI     Home Medications Prior to Admission medications   Medication Sig Start Date End Date Taking? Authorizing Provider  acetaminophen (TYLENOL) 500 MG tablet Take 500 mg by mouth every 6 (six) hours as needed for mild pain.    [provider]  ergocalciferol (VITAMIN D2) 1.25 MG (50000 UT) capsule Take 1 capsule (50,000 Units total) by mouth once a week. Patient not taking: Reported on 05/07/2022 09/18/21     escitalopram (LEXAPRO) 10 MG tablet Take 1 tablet (10 mg total) by mouth every evening. 10/04/21   [name], MD  famotidine (PEPCID) 20 MG tablet Take 1 tablet (20 mg total) by mouth 2 (two) times daily. 07/11/21   Concepcion Living, MD  fluticasone (FLONASE) 50 MCG/ACT nasal spray Place 2 sprays into both  nostrils daily. 05/07/22   [name], MD  gabapentin (NEURONTIN) 100 MG capsule Take 1 capsule (100 mg total) by mouth 2 (two) times daily. 07/17/22   Zenia Resides, MD  loratadine (CLARITIN) 10 MG tablet Take 1 tablet (10 mg total) by mouth daily. 05/07/22   [name], MD  pantoprazole (PROTONIX) 40 MG tablet Take 1 tablet (40 mg total) by mouth daily. 09/17/21     polyethylene glycol (MIRALAX) 17 g packet Take 17 g by mouth daily. 07/11/21   Concepcion Living, MD  PROAIR HFA 108 [phone] Base) MCG/ACT inhaler INHALE 2 PUFFS INTO THE LUNGS EVERY 4 (FOUR) HOURS AS NEEDED FOR UP TO 30 DAYS FOR SHORTNESS OF BREATH. 01/23/21 01/23/22  Cheek, Tamra, PA-C  triamcinolone cream (KENALOG) 0.1 % Apply 1 application topically 2 (two) times daily. 07/17/22   Zenia Resides, MD  triamcinolone (NASACORT AQ) 55 MCG/ACT AERO nasal inhaler Place 2 sprays into the nose daily. Patient not taking: Reported on 01/13/2018 11/14/16 03/20/20  Waynetta Pean, PA-C      Allergies    Penicillins and Zyrtec [cetirizine]    Review of Systems   Review of Systems  Constitutional:  Negative for chills and fever.  Eyes:  Negative for photophobia and visual disturbance.  Respiratory:  Positive for shortness of breath.   Cardiovascular:  Positive for chest pain. Negative for palpitations and leg swelling.  Gastrointestinal:  Negative for abdominal pain, nausea and vomiting.  Musculoskeletal:  Negative for back pain and myalgias.  Neurological:  Positive for numbness. Negative for weakness and headaches.    Physical Exam Updated Vital Signs BP 130/69   Pulse (!) 52   Temp 98.4 F (36.9 C) (Oral)   Resp 12   SpO2 95%  Physical Exam Vitals and nursing note reviewed.  Constitutional:      Appearance: Normal appearance.  HENT:     Head: Normocephalic and atraumatic.     Mouth/Throat:     Mouth: Mucous membranes are moist.  Eyes:     General: No scleral icterus.    Comments: Exotropia on the left.  Baseline for  patient.  Neck:     Comments: No midline or paraspinal tenderness.  Full range of motion. Cardiovascular:     Rate and Rhythm: Regular rhythm. Bradycardia present.     Pulses: Normal pulses.  Pulmonary:     Effort: Pulmonary effort is normal. No respiratory distress.     Breath sounds: Normal breath sounds.     Comments: No respiratory distress, accessory muscle use, tripoding, nasal flaring, or cyanosis present.  Patient speaking in full sentences and satting well on room air with any increased work of breathing. Abdominal:     General: Abdomen is flat. Bowel sounds are normal.     Palpations: Abdomen is soft.     Tenderness: There is no abdominal tenderness. There is no guarding or rebound.  Musculoskeletal:        General: No deformity.     Cervical back: Normal range of motion. No tenderness.     Comments: Patient has some tenderness to his medial epicondyle on the left.  He has full range of motion of his shoulder and elbow and wrist.  Equal grip strength.  Compartments are soft.   Reports some subjective sensory changes to his left forearm.  No overlying skin changes noted.  Palpable pulses.  Brisk cap refill.  Skin:    General: Skin is warm and dry.  Neurological:     General: No focal deficit present.     Mental Status: He is alert. Mental status is at baseline.     GCS: GCS eye subscore is 4. GCS verbal subscore is 5. GCS motor subscore is 6.     Cranial Nerves: No cranial nerve deficit, dysarthria or facial asymmetry.     Sensory: Sensory deficit present.     Motor: No weakness or pronator drift.     Gait: Gait is intact.     Comments: He is answering questions appropriate with appropriate speech.  He does have some subjective tsensory deficit to his left lower forearm .  No strength or motor deficit.  No pronator drift.       ED Results / Procedures / Treatments   Labs (all labs ordered are listed, but only abnormal results are displayed) Labs Reviewed  BASIC METABOLIC  PANEL - Abnormal; Notable for the following components:      Result Value   Glucose, Bld 102 (*)    Creatinine, Ser 1.45 (*)    All other components within normal limits  CBC  TROPONIN I (HIGH SENSITIVITY)  TROPONIN I (HIGH SENSITIVITY)    EKG EKG Interpretation  Date/Time:  Friday July 18 2022 10:26:48 EDT Ventricular Rate:  64 PR Interval:  150 QRS Duration: 80 QT Interval:  384 QTC Calculation: 396 R Axis:   81 Text Interpretation: Normal sinus rhythm Normal  ECG When compared with ECG of 24-Dec-2018 11:23, PREVIOUS ECG IS PRESENT No significant change was found Confirmed by Daleen Bo [phone]) on 07/18/2022 10:46:54 AM  Radiology DG Chest 2 View  Result Date: 07/18/2022 CLINICAL DATA:  Chest pain and LEFT arm numbness. EXAM: CHEST - 2 VIEW COMPARISON:  December 24, 2018 FINDINGS: The heart size and mediastinal contours are within normal limits. Both lungs are clear. The visualized skeletal structures are unremarkable. IMPRESSION: No active cardiopulmonary disease. Electronically Signed   By: Zetta Bills M.D.   On: 07/18/2022 10:48    Procedures Procedures   Medications Ordered in ED Medications - No data to display  ED Course/ Medical Decision Making/ A&P                           Medical Decision Making Amount and/or Complexity of Data Reviewed Labs: ordered. Radiology: ordered.  Risk OTC drugs. Prescription drug management.   31 year old male presents the emergency room for evaluation of left lower arm numbness as well as some momentary chest pain and shortness of breath this morning.  Differential diagnosis includes was not limited to MI, arrhythmia, pneumonia, pneumothorax, PE, epicondylitis, stroke, radiculopathy, anxiety.  Vital signs show bradycardia, otherwise normal.  Physical exam as listed above.  I doubt any stroke given that the patient only has sensory changes whenever he rests his elbow on something and is relieved whenever his arm is straightened  out or not resting on a surface.  Exam is more consistent with medial epicondylitis given the tenderness to the medial aspect of his left elbow as well as reproducible symptoms upon palpation.  Will order chest pain work-up as well.  On chart review, patient was seen yesterday at his primary care office and did not mention this symptom.  He reports that he was seen because of neck problems.  Patient reports that he cannot wear regular teachers as he feels like they are suffocating him or choking him and is only been able to wear V-max.  He was started on 100 mg of gabapentin for the anxiety.  Patient has not picked up this medication yet.  I independently reviewed and interpreted the patient's labs.  BMP shows slightly elevated glucose at 102.  Creatinine 1.45 which appears to be around patient's baseline.  No other electrolyte abnormality.  CBC without leukocytosis or anemia.  Troponin at 4 with repeat of 4, delta 0.  Chest x-ray shows no acute cardiopulmonary process.  EKG was reviewed and interpreted by my attending and read as normal sinus rhythm.  Given the patient's reassuring labs and imaging as well as his physical exam, I do think this patient is safe for discharge.  I will give him follow-up with orthopedic provider for reevaluation of his medial epicondylitis.  I advised him to pick up an elbow brace over-the-counter.  I sent him home with a prescription of lidocaine patches to apply to the area.  I likely think his chest pain was from anxiety.  I doubt any pneumonia or pneumothorax given the patient's x-ray.  The chest pain was only momentarily, I doubt any ACS given reassuring EKG and troponins.  Story is not consistent with a PE.    We discussed return precautions and red flag symptoms.  Patient verbalized understanding and agrees to the plan.  Patient is stable and being discharged home in good condition.  I discussed this case with my attending physician who cosigned this note including  patient's  presenting symptoms, physical exam, and planned diagnostics and interventions. Attending physician stated agreement with plan or made changes to plan which were implemented.   Final Clinical Impression(s) / ED Diagnoses Final diagnoses:  Medial epicondylitis of left elbow  Chest pain, unspecified type    Rx / DC Orders ED Discharge Orders          Ordered    lidocaine (HM LIDOCAINE PATCH) 4 %  Every 24 hours        07/18/22 1704              Sherrell Puller, PA-C 07/18/22 1715    Sherrell Puller, PA-C 07/18/22 1716    Daleen Bo, MD 07/21/22 [phone]

## 2022-07-21 ENCOUNTER — Other Ambulatory Visit (HOSPITAL_COMMUNITY): Payer: Self-pay

## 2022-07-23 ENCOUNTER — Other Ambulatory Visit (HOSPITAL_COMMUNITY): Payer: Self-pay

## 2022-07-24 ENCOUNTER — Other Ambulatory Visit (HOSPITAL_COMMUNITY): Payer: Self-pay

## 2022-07-25 DIAGNOSIS — F4310 Post-traumatic stress disorder, unspecified: Secondary | ICD-10-CM | POA: Diagnosis not present

## 2022-08-01 ENCOUNTER — Ambulatory Visit: Payer: Medicaid Other | Admitting: Student

## 2022-08-01 DIAGNOSIS — F4010 Social phobia, unspecified: Secondary | ICD-10-CM | POA: Diagnosis not present

## 2022-08-08 DIAGNOSIS — F4010 Social phobia, unspecified: Secondary | ICD-10-CM | POA: Diagnosis not present

## 2022-08-11 ENCOUNTER — Ambulatory Visit (INDEPENDENT_AMBULATORY_CARE_PROVIDER_SITE_OTHER): Payer: Medicaid Other | Admitting: Family Medicine

## 2022-08-11 ENCOUNTER — Other Ambulatory Visit (HOSPITAL_COMMUNITY)
Admission: RE | Admit: 2022-08-11 | Discharge: 2022-08-11 | Disposition: A | Discharge status: Home / Self Care | Payer: Medicaid Other | Source: Ambulatory Visit | Attending: Family Medicine | Admitting: Family Medicine

## 2022-08-11 ENCOUNTER — Encounter: Payer: Self-pay | Admitting: Family Medicine

## 2022-08-11 ENCOUNTER — Other Ambulatory Visit (HOSPITAL_COMMUNITY): Payer: Self-pay

## 2022-08-11 VITALS — BP 121/77 | HR 66 | Wt 220.8 lb

## 2022-08-11 DIAGNOSIS — K219 Gastro-esophageal reflux disease without esophagitis: Secondary | ICD-10-CM

## 2022-08-11 DIAGNOSIS — R3989 Other symptoms and signs involving the genitourinary system: Secondary | ICD-10-CM | POA: Insufficient documentation

## 2022-08-11 DIAGNOSIS — F419 Anxiety disorder, unspecified: Secondary | ICD-10-CM | POA: Diagnosis not present

## 2022-08-11 DIAGNOSIS — Z1159 Encounter for screening for other viral diseases: Secondary | ICD-10-CM | POA: Diagnosis not present

## 2022-08-11 MED ORDER — ESCITALOPRAM OXALATE 5 MG PO TABS
5.0000 mg | ORAL_TABLET | Freq: Every day | ORAL | 3 refills | Status: DC
  Filled 2022-08-11 – 2022-08-20 (×2): qty 30, 30d supply, fill #0
  Filled 2023-01-19: qty 30, 30d supply, fill #1
  Filled 2023-02-16: qty 30, 30d supply, fill #2
  Filled 2023-03-24: qty 30, 30d supply, fill #3

## 2022-08-11 NOTE — Assessment & Plan Note (Signed)
Suspect primary driver of symptoms  Restart Lexapro 5 mg daily Recommend ongoing discussion of counseling at follow up with PCP

## 2022-08-11 NOTE — Patient Instructions (Addendum)
It was wonderful to see you today.  Please bring ALL of your medications with you to every visit.   Today we talked about: --Restart your Lexapro 5 mg  - Take Pantoprazole once a day before breakfast      - Your ear looks okay--you had some wax  - Please follow up in 1 month   Please follow up in 1 months   Thank you for choosing [facility].   Please call [phone] with any questions about today's appointment.  Please be sure to schedule follow up at the front  desk before you leave today.   Dorris Singh, MD  Family Medicine

## 2022-08-11 NOTE — Assessment & Plan Note (Signed)
Discussed using wedge pillow Trial Gaviscon No alarm symptoms currently, monitor Continue dietary changes and PPI ONCE daily Consider peppermint oil in future

## 2022-08-11 NOTE — Progress Notes (Signed)
    SUBJECTIVE:   CHIEF COMPLAINT: anxiety HPI:   Cody [name] is a 31 y.o.  with history notable for anxiety symptoms presenting for follow up on anxiety and multiple other concerns.  Ejaculation Reports when he masturbates he feels a strange sensation go up to his stomach. Also feels his heart race afterwards. He thought this may be related to lexapro and thus stopped this 7 days ago. Reports he has no issues with delayed ejaculation but feels volume may not be as much while on lexapro.   GERD Reports burning in his stomach, chest pain, and throat, particularly after caffeine. No alcohol or tobacco use. Does not avoid foods that trigger symptoms but has been trying to cut down on caffeine. Takes PPI BID. No weight loss, melena, hematochezia.    Anxiety Reports he stopped Lexapro a week ago. Noted L arm cold sensation and worsening anxiety. Denies SI/HI. Agreeable to restarting Lexapro at lower dose.   PERTINENT  PMH / PSH/Family/Social History : history of anxiety on Lexapro  No EtOH or substance use Not working currently.   OBJECTIVE:   BP 121/77   Pulse 66   Wt 220 lb 12.8 oz (100.2 kg)   SpO2 100%   BMI 30.80 kg/m   Today's weight:  Last Weight  Most recent update: 08/11/2022 10:39 AM    Weight  100.2 kg (220 lb 12.8 oz)            Review of prior weights: Filed Weights   08/11/22 1037  Weight: 220 lb 12.8 oz (100.2 kg)     Cardiac: Regular rate and rhythm. Normal S1/S2. No murmurs, rubs, or gallops appreciated. Lungs: Clear bilaterally to ascultation.  Abdomen: Normoactive bowel sounds. No tenderness to deep or light palpation. No rebound or guarding.   Psych: Pleasant and appropriate    ASSESSMENT/PLAN:   Gastroesophageal reflux disease without esophagitis Discussed using wedge pillow Trial Gaviscon No alarm symptoms currently, monitor Continue dietary changes and PPI ONCE daily Consider peppermint oil in future   Anxiety Suspect primary driver of  symptoms  Restart Lexapro 5 mg daily Recommend ongoing discussion of counseling at follow up with PCP  Consider evaluation for secondary disorder at follow up   Ejaculation Issues, discussed change in how orgasm may change with age, different stimulations  Test GC/CT Reassurance provided  HCM Hep C today     Dorris Singh, MD  [place]

## 2022-08-12 LAB — URINE CYTOLOGY ANCILLARY ONLY
Chlamydia: NEGATIVE
Comment: NEGATIVE
Comment: NORMAL
Neisseria Gonorrhea: NEGATIVE

## 2022-08-12 LAB — HCV INTERPRETATION

## 2022-08-12 LAB — HCV AB W REFLEX TO QUANT PCR: HCV Ab: NONREACTIVE

## 2022-08-14 IMAGING — US US SCROTUM
1 series · 14 of 25 positions shown · non-contrast
Comparison: None.

CLINICAL DATA: Left scrotal pain.

EXAM:
ULTRASOUND OF SCROTUM
TECHNIQUE: Complete ultrasound examination of the testicles, epididymis, and
other scrotal structures was performed.

[Series 1: us scrotum · 14 of 67 slices shown]
[im 1/67]
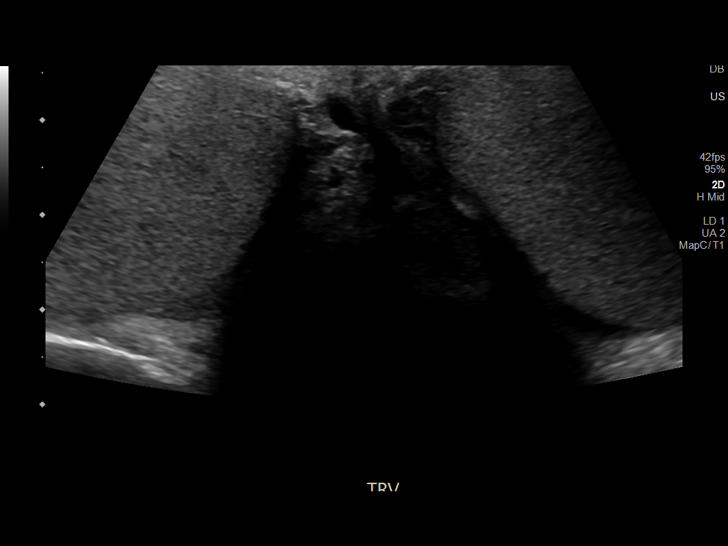
[im 6/67]
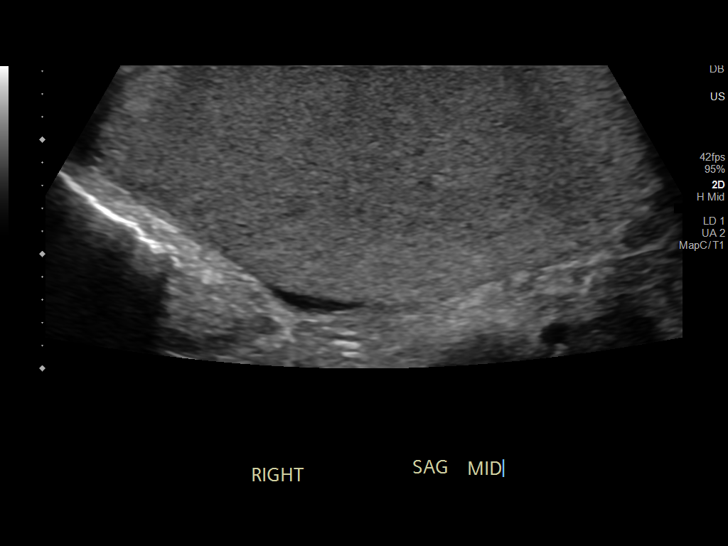
[im 12/67]
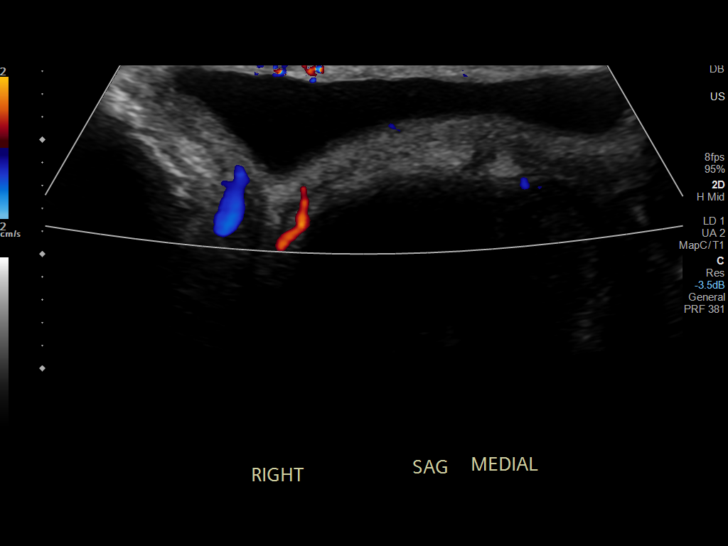
[im 17/67]
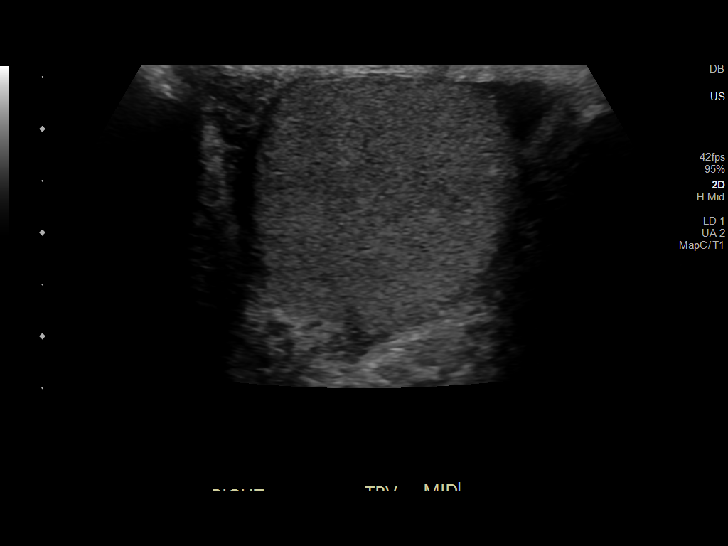
[im 23/67]
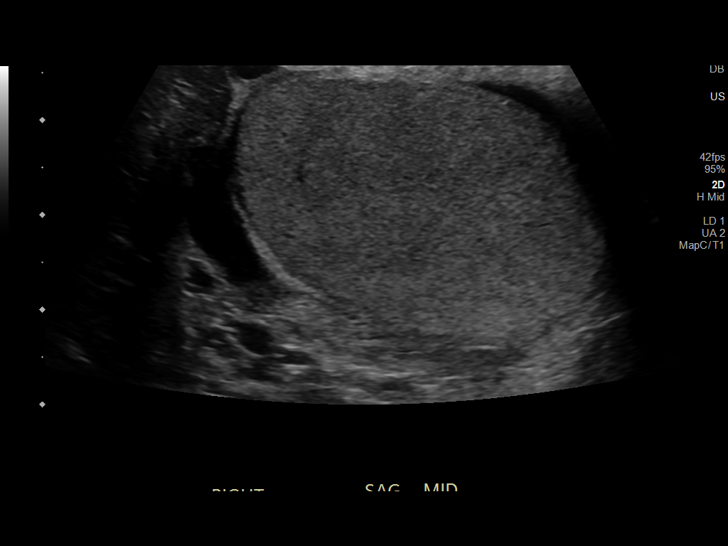
[im 25/67]
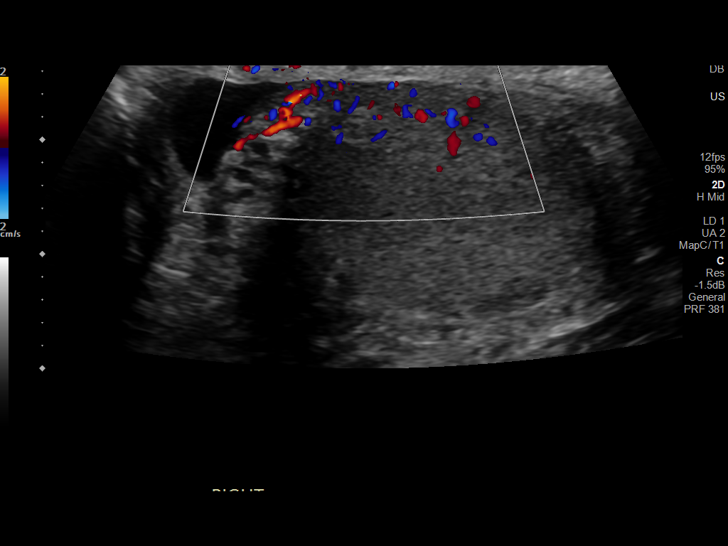
[im 31/67]
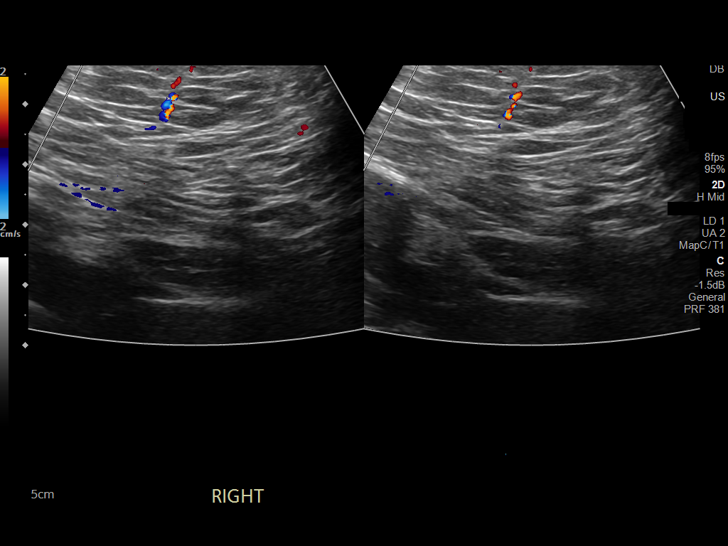
[im 36/67]
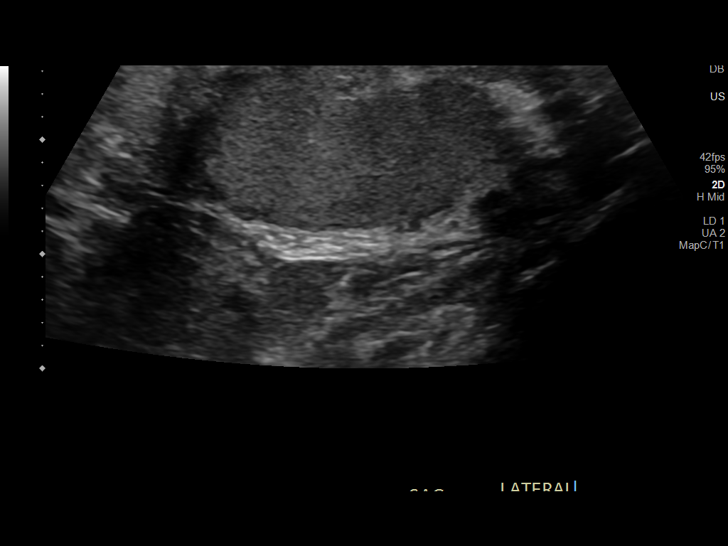
[im 42/67]
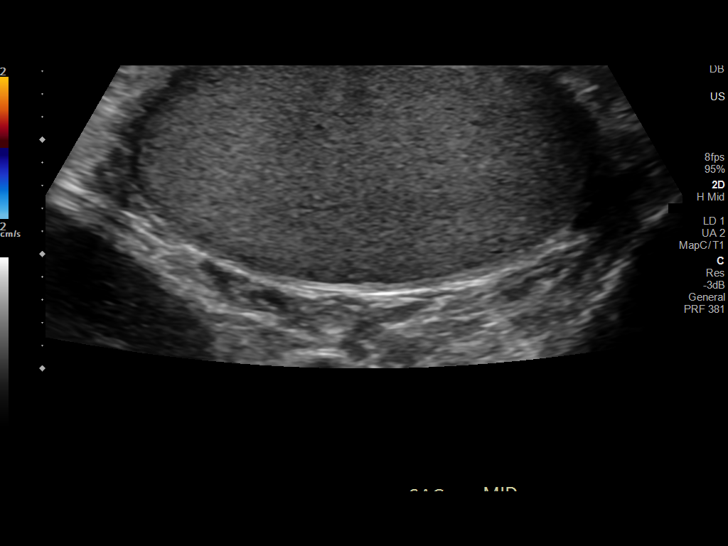
[im 45/67]
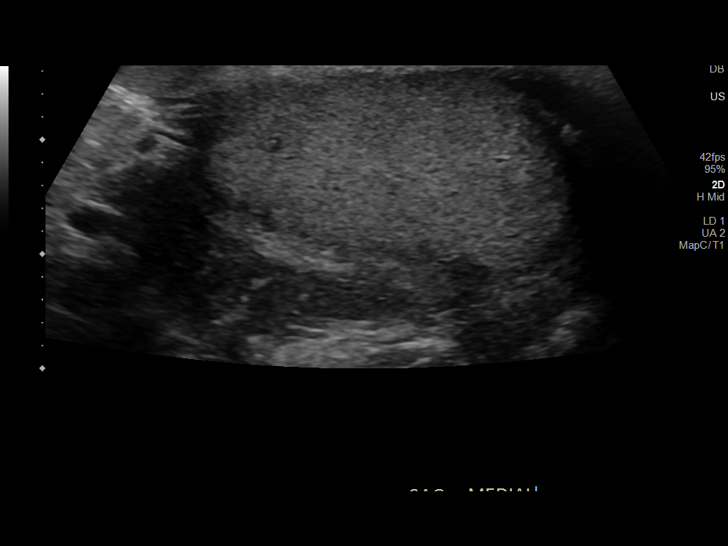
[im 50/67]
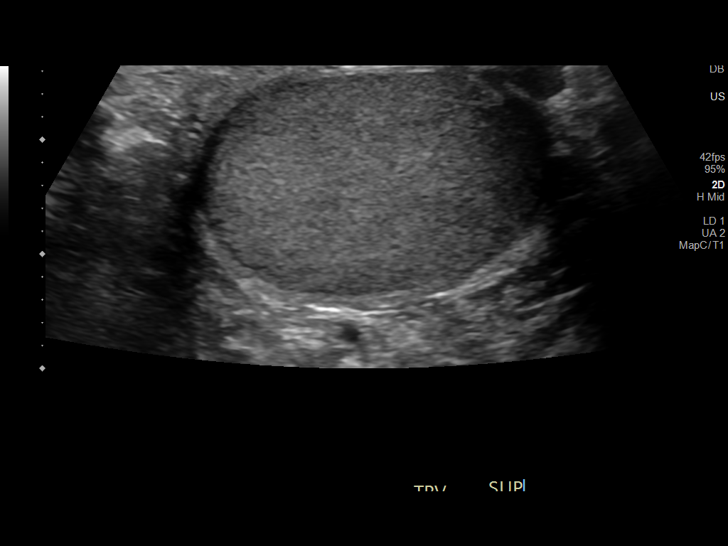
[im 56/67]
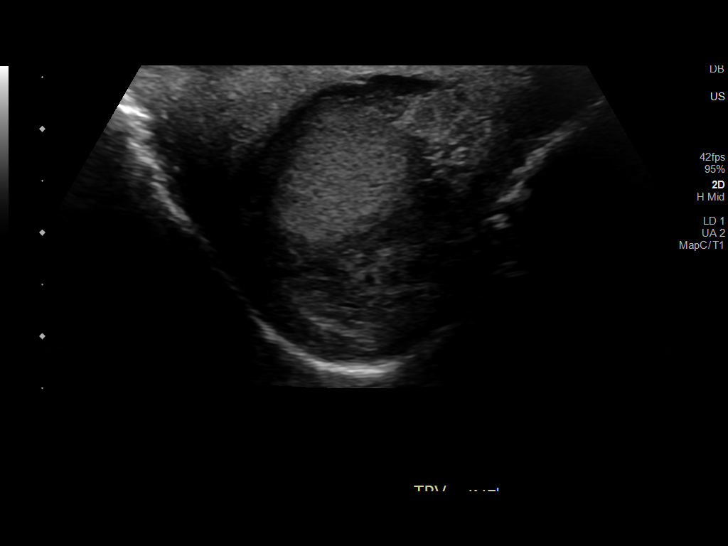
[im 61/67]
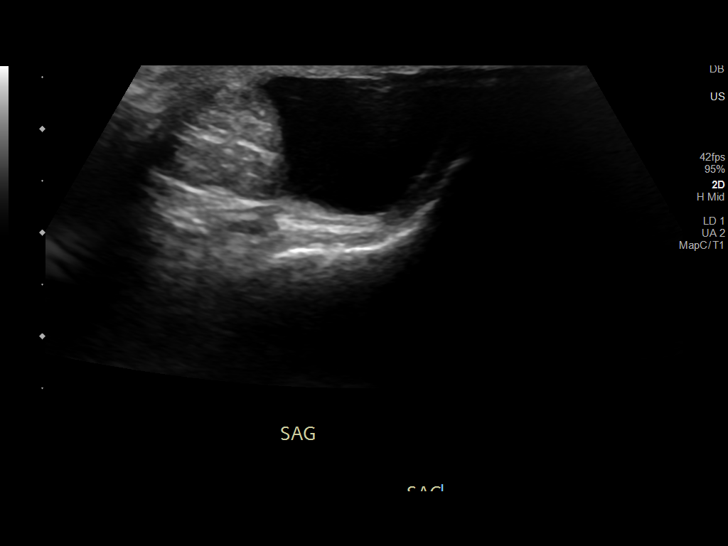
[im 67/67]
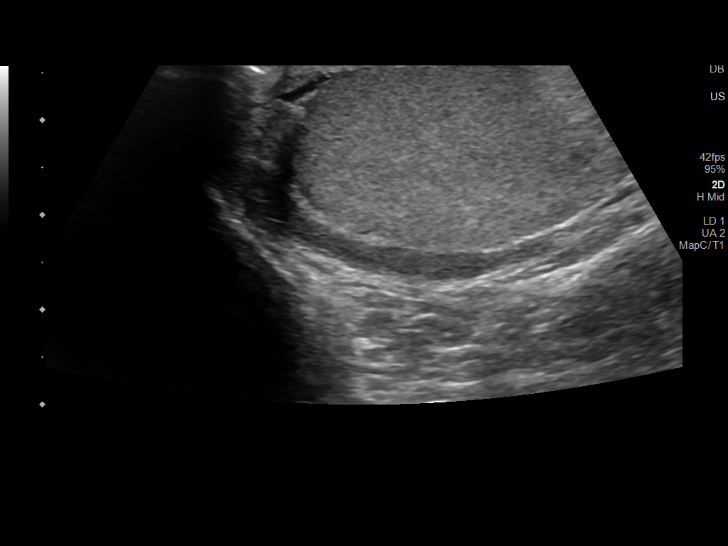

[14 of 25 positions shown; findings below may reference images not displayed]

FINDINGS: Right testicle

Measurements: 4.8 x 2.1 x 2.4 cm. No mass or microlithiasis
visualized. Normal color Doppler blood flow.

Left testicle

Measurements: 4.2 x 1.9 x 2.8 cm. No mass or microlithiasis
visualized. Normal color Doppler blood flow.

Right epididymis:  Normal in size and appearance.

Left epididymis:  Normal in size and appearance.

Hydrocele:  Trace left hydrocele.

Varicocele:  None visualized.
IMPRESSION: 1. No acute findings. No findings to account for scrotal pain. Trace
left hydrocele. Otherwise normal.

## 2022-08-15 DIAGNOSIS — F4010 Social phobia, unspecified: Secondary | ICD-10-CM | POA: Diagnosis not present

## 2022-08-19 ENCOUNTER — Other Ambulatory Visit (HOSPITAL_COMMUNITY): Payer: Self-pay

## 2022-08-19 ENCOUNTER — Ambulatory Visit: Payer: Medicaid Other | Admitting: Student

## 2022-08-20 ENCOUNTER — Other Ambulatory Visit (HOSPITAL_COMMUNITY): Payer: Self-pay

## 2022-08-22 DIAGNOSIS — F4010 Social phobia, unspecified: Secondary | ICD-10-CM | POA: Diagnosis not present

## 2022-08-26 ENCOUNTER — Telehealth: Payer: Self-pay

## 2022-08-26 NOTE — Telephone Encounter (Signed)
Patient calls nurse line requesting to schedule follow up appointment for visual changes and acid reflux. Patient reports that for the last few days, he has noticed a change in his vision with how he perceives colors. He is very concerned about this. We do not have any appointments until next week. Recommended that patient be evaluated in Urgent care due to new onset visual changes.   Talbot Grumbling, RN

## 2022-08-27 ENCOUNTER — Emergency Department (HOSPITAL_COMMUNITY)
Admission: EM | Admit: 2022-08-27 | Discharge: 2022-08-27 | Disposition: A | Discharge status: Home / Self Care | Payer: Medicaid Other | Attending: Emergency Medicine | Admitting: Emergency Medicine

## 2022-08-27 ENCOUNTER — Encounter (HOSPITAL_COMMUNITY): Payer: Self-pay | Admitting: Emergency Medicine

## 2022-08-27 ENCOUNTER — Other Ambulatory Visit: Payer: Self-pay

## 2022-08-27 DIAGNOSIS — R1032 Left lower quadrant pain: Secondary | ICD-10-CM | POA: Diagnosis present

## 2022-08-27 DIAGNOSIS — K219 Gastro-esophageal reflux disease without esophagitis: Secondary | ICD-10-CM | POA: Insufficient documentation

## 2022-08-27 DIAGNOSIS — H509 Unspecified strabismus: Secondary | ICD-10-CM | POA: Insufficient documentation

## 2022-08-27 DIAGNOSIS — H539 Unspecified visual disturbance: Secondary | ICD-10-CM

## 2022-08-27 NOTE — ED Triage Notes (Signed)
Pt reports he can has a bitter taste in his salvia randomly. Feels like it is coming from his stomach and wants to make sure its not bacteria. Denies abd pain. States this is effecting his eyes and his colors are off.

## 2022-08-27 NOTE — ED Provider Notes (Signed)
[facility] EMERGENCY DEPARTMENT Provider Note   CSN: [id number] Arrival date & time: 08/27/22  1047     History  Chief Complaint  Patient presents with   Gastroesophageal Reflux    Cody [name] is a 31 y.o. male presenting to the ED with multiple complaints.  The patient reports he suffers from reflux chronically is on pantoprazole, gets feelings where acid washes up his stomach and has a bitter taste in his mouth.  He has noted an association between this and masturbation, which had him concerned.  He reports that sometimes when he masturbates he has some pain in his lower left abdomen.  He denies inserting any items into the urethra or penis.  He is not having any persistent pain at this time.  He was also concerned because he states that he woke up and noted that his vision seemed "off".  He says that "like the green color tents are off".  Denies loss of vision.  Denies pain in his eyes.  He does have a "lazy eye" in his left eye.  He has not seen an eye doctor or optometrist in over 3 years.  HPI     Home Medications Prior to Admission medications   Medication Sig Start Date End Date Taking? Authorizing Provider  escitalopram (LEXAPRO) 5 MG tablet Take 1 tablet (5 mg total) by mouth daily. 08/11/22   Martyn Malay, MD  famotidine (PEPCID) 20 MG tablet Take 1 tablet (20 mg total) by mouth 2 (two) times daily. Patient not taking: Reported on 08/11/2022 07/11/21   Concepcion Living, MD  fluticasone [facility]) 50 MCG/ACT nasal spray Place 2 sprays into both nostrils daily. 05/07/22   [name], MD  lidocaine (HM LIDOCAINE PATCH) 4 % Place 1 patch onto the skin daily. 07/18/22   Sherrell Puller, PA-C  loratadine (CLARITIN) 10 MG tablet Take 1 tablet (10 mg total) by mouth daily. 05/07/22   [name], MD  pantoprazole (PROTONIX) 40 MG tablet Take 1 tablet (40 mg total) by mouth daily. 09/17/21     polyethylene glycol (MIRALAX) 17 g packet Take 17 g by mouth daily.  07/11/21   Concepcion Living, MD  PROAIR HFA 108 [phone] Base) MCG/ACT inhaler INHALE 2 PUFFS INTO THE LUNGS EVERY 4 (FOUR) HOURS AS NEEDED FOR UP TO 30 DAYS FOR SHORTNESS OF BREATH. 01/23/21 01/23/22  Cheek, Tamra, PA-C  triamcinolone cream (KENALOG) 0.1 % Apply 1 application topically 2 (two) times daily. 07/17/22   Zenia Resides, MD  triamcinolone (NASACORT AQ) 55 MCG/ACT AERO nasal inhaler Place 2 sprays into the nose daily. Patient not taking: Reported on 01/13/2018 11/14/16 03/20/20  Waynetta Pean, PA-C      Allergies    Penicillins and Zyrtec [cetirizine]    Review of Systems   Review of Systems  Physical Exam Updated Vital Signs BP (!) 146/84 (BP Location: Right Arm)   Pulse 65   Temp 97.9 F (36.6 C) (Oral)   Resp 17   Ht '5\' 11"'$  (1.803 m)   Wt 98.4 kg   SpO2 100%   BMI 30.27 kg/m  Physical Exam Constitutional:      General: He is not in acute distress. HENT:     Head: Normocephalic and atraumatic.     Comments: Strabismus left eye, vision is grossly intact Eyes:     Conjunctiva/sclera: Conjunctivae normal.     Pupils: Pupils are equal, round, and reactive to light.  Cardiovascular:     Rate and  Rhythm: Normal rate and regular rhythm.     Pulses: Normal pulses.  Pulmonary:     Effort: Pulmonary effort is normal. No respiratory distress.  Abdominal:     General: There is no distension.     Tenderness: There is no abdominal tenderness. There is no guarding.  Skin:    General: Skin is warm and dry.  Neurological:     General: No focal deficit present.     Mental Status: He is alert and oriented to person, place, and time. Mental status is at baseline.  Psychiatric:        Mood and Affect: Mood normal.        Behavior: Behavior normal.     ED Results / Procedures / Treatments   Labs (all labs ordered are listed, but only abnormal results are displayed) Labs Reviewed - No data to display  EKG None  Radiology No results found.  Procedures Procedures     Medications Ordered in ED Medications - No data to display  ED Course/ Medical Decision Making/ A&P                           Medical Decision Making  Patient is here with several complaints.  I suspect he likely has chronic ongoing reflux issues, which may be exacerbated by positioning or abdominal tension while masturbating.  We did discuss dietary recommendations and fasting 3 hours before bedtime, try not to spend too much time flat on his back.  He is already on pantoprazole we will follow-up with PCP for additional medications if deemed appropriate.  Is uncertain what to make of his visual changes.  Have a low suspicion for retinal detachment, there is no vision loss.  I do not see signs or symptoms of infection, hypopyon.  This sounds like it may be an intraocular issue or degenerative issue with the rods and cones if it is specifically affecting his color perception.  I would recommend that he follow-up with an ophthalmologist and a phone number was provided.  This is not consistent with a stroke, I do not feel that he needs emergent CT or MR imaging of the brain or orbits at this time.        Final Clinical Impression(s) / ED Diagnoses Final diagnoses:  Vision changes  Gastroesophageal reflux disease, unspecified whether esophagitis present    Rx / DC Orders ED Discharge Orders     None         Wyvonnia Dusky, MD 08/27/22 1234

## 2022-08-27 NOTE — Discharge Instructions (Addendum)
You should call your primary care doctor's office to talk about your reflux control.  You are likely experiencing heartburn symptoms.  We talked about not eating 3 hours before bedtime.  Also trying to sleep propped up on 1-2 pillows and not to lay completely flat on your back.  Please call the eye doctor's office to schedule a new patient appointment for your visual changes.  If you have loss of vision, or sudden or worsening pain in your eyes, return to the ER.

## 2022-08-28 DIAGNOSIS — L280 Lichen simplex chronicus: Secondary | ICD-10-CM | POA: Diagnosis not present

## 2022-08-29 DIAGNOSIS — F4010 Social phobia, unspecified: Secondary | ICD-10-CM | POA: Diagnosis not present

## 2022-09-05 DIAGNOSIS — F4010 Social phobia, unspecified: Secondary | ICD-10-CM | POA: Diagnosis not present

## 2022-09-09 NOTE — Patient Instructions (Incomplete)
It was nice seeing you today!  Make sure to keep your eye appointment next week.  Stay well, Littie Deeds, MD [facility] [phone]  --  Make sure to check out at the front desk before you leave today.  Please arrive at least 15 minutes prior to your scheduled appointments.  If you had blood work today, I will send you a MyChart message or a letter if results are normal. Otherwise, I will give you a call.  If you had a referral placed, they will call you to set up an appointment. Please give [place] a call if you don't hear back in the next 2 weeks.  If you need additional refills before your next appointment, please call your pharmacy first.

## 2022-09-09 NOTE — Progress Notes (Unsigned)
    SUBJECTIVE:   CHIEF COMPLAINT / HPI:  No chief complaint on file.   ***  PERTINENT  PMH / PSH: ***  Patient Care Team: [name], MD as PCP - General (Family Medicine)   OBJECTIVE:   There were no vitals taken for this visit.  Physical Exam      07/17/2022   10:54 AM  Depression screen PHQ 2/9  Decreased Interest 2  Down, Depressed, Hopeless 2  PHQ - 2 Score 4  Altered sleeping 3  Tired, decreased energy 2  Change in appetite 0  Feeling bad or failure about yourself  0  Trouble concentrating 2  Moving slowly or fidgety/restless 0  Suicidal thoughts 0  PHQ-9 Score 11     {Show previous vital signs (optional):23777}  {Labs  Heme  Chem  Endocrine  Serology  Results Review (optional):23779}  ASSESSMENT/PLAN:   No problem-specific Assessment & Plan notes found for this encounter.    No follow-ups on file.   [name], MD [place]

## 2022-09-11 ENCOUNTER — Ambulatory Visit (INDEPENDENT_AMBULATORY_CARE_PROVIDER_SITE_OTHER): Payer: Medicaid Other | Admitting: Family Medicine

## 2022-09-11 ENCOUNTER — Other Ambulatory Visit (HOSPITAL_COMMUNITY): Payer: Self-pay

## 2022-09-11 ENCOUNTER — Encounter: Payer: Self-pay | Admitting: Family Medicine

## 2022-09-11 VITALS — BP 114/71 | HR 56 | Ht 71.0 in | Wt 218.2 lb

## 2022-09-11 DIAGNOSIS — Z23 Encounter for immunization: Secondary | ICD-10-CM | POA: Diagnosis not present

## 2022-09-11 DIAGNOSIS — F419 Anxiety disorder, unspecified: Secondary | ICD-10-CM

## 2022-09-11 DIAGNOSIS — H539 Unspecified visual disturbance: Secondary | ICD-10-CM | POA: Diagnosis not present

## 2022-09-11 MED ORDER — OMEPRAZOLE 20 MG PO CPDR
20.0000 mg | DELAYED_RELEASE_CAPSULE | Freq: Every day | ORAL | 3 refills | Status: DC
  Filled 2022-09-11 – 2022-10-02 (×2): qty 30, 30d supply, fill #0
  Filled 2022-10-24 – 2022-10-27 (×2): qty 30, 30d supply, fill #1
  Filled 2022-11-25: qty 30, 30d supply, fill #2
  Filled 2022-12-17 – 2023-01-07 (×2): qty 30, 30d supply, fill #3

## 2022-09-11 NOTE — Assessment & Plan Note (Signed)
Improving with S-Citalopram.  Established with therapy.

## 2022-09-12 DIAGNOSIS — F4010 Social phobia, unspecified: Secondary | ICD-10-CM | POA: Diagnosis not present

## 2022-09-14 ENCOUNTER — Encounter (HOSPITAL_COMMUNITY): Payer: Self-pay

## 2022-09-14 ENCOUNTER — Other Ambulatory Visit: Payer: Self-pay

## 2022-09-14 ENCOUNTER — Emergency Department (HOSPITAL_COMMUNITY)
Admission: EM | Admit: 2022-09-14 | Discharge: 2022-09-14 | Disposition: A | Discharge status: Home / Self Care | Payer: Medicaid Other | Attending: Emergency Medicine | Admitting: Emergency Medicine

## 2022-09-14 ENCOUNTER — Emergency Department (HOSPITAL_COMMUNITY): Payer: Medicaid Other

## 2022-09-14 DIAGNOSIS — J45909 Unspecified asthma, uncomplicated: Secondary | ICD-10-CM | POA: Diagnosis not present

## 2022-09-14 DIAGNOSIS — Z7951 Long term (current) use of inhaled steroids: Secondary | ICD-10-CM | POA: Diagnosis not present

## 2022-09-14 DIAGNOSIS — R002 Palpitations: Secondary | ICD-10-CM | POA: Insufficient documentation

## 2022-09-14 DIAGNOSIS — H509 Unspecified strabismus: Secondary | ICD-10-CM | POA: Diagnosis not present

## 2022-09-14 DIAGNOSIS — R519 Headache, unspecified: Secondary | ICD-10-CM | POA: Diagnosis not present

## 2022-09-14 LAB — BASIC METABOLIC PANEL
Anion gap: 7 (ref 5–15)
BUN: 12 mg/dL (ref 6–20)
CO2: 26 mmol/L (ref 22–32)
Calcium: 9.2 mg/dL (ref 8.9–10.3)
Chloride: 106 mmol/L (ref 98–111)
Creatinine, Ser: 1.31 mg/dL — ABNORMAL HIGH (ref 0.61–1.24)
GFR, Estimated: >60 mL/min (ref >60)
Glucose, Bld: 101 mg/dL — ABNORMAL HIGH (ref 70–99)
Potassium: 4.1 mmol/L (ref 3.5–5.1)
Sodium: 139 mmol/L (ref 135–145)

## 2022-09-14 LAB — CBC
HCT: 47.9 % (ref 39.0–52.0)
Hemoglobin: 15.7 g/dL (ref 13.0–17.0)
MCH: 30.4 pg (ref 26.0–34.0)
MCHC: 32.8 g/dL (ref 30.0–36.0)
MCV: 92.6 fL (ref 80.0–100.0)
Platelets: 279 10*3/uL (ref 150–400)
RBC: 5.17 MIL/uL (ref 4.22–5.81)
RDW: 13.2 % (ref 11.5–15.5)
WBC: 5.5 10*3/uL (ref 4.0–10.5)
nRBC: 0 % (ref 0.0–0.2)

## 2022-09-14 LAB — TROPONIN I (HIGH SENSITIVITY)
Troponin I (High Sensitivity): 3 ng/L (ref <18)
Troponin I (High Sensitivity): 2 ng/L (ref <18)

## 2022-09-14 MED ORDER — DEXAMETHASONE SODIUM PHOSPHATE 4 MG/ML IJ SOLN
4.0000 mg | Freq: Once | INTRAMUSCULAR | Status: AC
  Administered 2022-09-14: 4 mg via INTRAVENOUS
  Filled 2022-09-14: qty 1

## 2022-09-14 MED ORDER — KETOROLAC TROMETHAMINE 15 MG/ML IJ SOLN
15.0000 mg | Freq: Once | INTRAMUSCULAR | Status: AC
Start: 2022-09-14 — End: 2022-09-14
  Administered 2022-09-14: 15 mg via INTRAVENOUS
  Filled 2022-09-14: qty 1

## 2022-09-14 NOTE — ED Triage Notes (Signed)
Patient complains of palpitations x 3 last night and complains of head pressure. Patient alert and oriented, NAD. No chest pain

## 2022-09-14 NOTE — ED Provider Notes (Signed)
[facility] EMERGENCY DEPARTMENT Provider Note   CSN: [id number] Arrival date & time: 09/14/22  1010     History  Chief Complaint  Patient presents with   Headache    Cody [name] is a 31 y.o. male. Patient presents to the hospital complaining of visual color change followed by a headache. The patient states that at 1AM he began to feel heart palpitations.  He states that he felt that his heart was beating strangely.  Shortly thereafter he noticed that his vision flashed white and he began to have a headache.  He still complains of a headache at this time.  The palpitations have resolved.  He states that he can see normally but there is still a slightly strange look to his vision.  He states that this happened earlier this month and at that time his vision turned to green prior to the headache.  Brief chart review shows emergency department visit for vision changes and headache with other symptoms on September 6.  Patient with family medicine appointment on September 21 for anxiety where fluorescein exam was normal.  Patient was still complaining of color vision changes at that time.  Patient reportedly has an ophthalmology visit arranged for next week.  Past medical history significant for asthma, GERD, TBI, anxiety  HPI     Home Medications Prior to Admission medications   Medication Sig Start Date End Date Taking? Authorizing Provider  escitalopram (LEXAPRO) 5 MG tablet Take 1 tablet (5 mg total) by mouth daily. 08/11/22   Martyn Malay, MD  famotidine (PEPCID) 20 MG tablet Take 1 tablet (20 mg total) by mouth 2 (two) times daily. Patient not taking: Reported on 08/11/2022 07/11/21   Concepcion Living, MD  fluticasone [facility]) 50 MCG/ACT nasal spray Place 2 sprays into both nostrils daily. 05/07/22   [name], MD  lidocaine (HM LIDOCAINE PATCH) 4 % Place 1 patch onto the skin daily. 07/18/22   Sherrell Puller, PA-C  loratadine (CLARITIN) 10 MG tablet Take 1 tablet (10  mg total) by mouth daily. 05/07/22   [name], MD  omeprazole (PRILOSEC) 20 MG capsule Take 1 capsule (20 mg total) by mouth daily. 09/11/22   [name], MD  polyethylene glycol (MIRALAX) 17 g packet Take 17 g by mouth daily. 07/11/21   Concepcion Living, MD  PROAIR HFA 108 [phone] Base) MCG/ACT inhaler INHALE 2 PUFFS INTO THE LUNGS EVERY 4 (FOUR) HOURS AS NEEDED FOR UP TO 30 DAYS FOR SHORTNESS OF BREATH. 01/23/21 01/23/22  Cheek, Tamra, PA-C  triamcinolone cream (KENALOG) 0.1 % Apply 1 application topically 2 (two) times daily. 07/17/22   Zenia Resides, MD  triamcinolone (NASACORT AQ) 55 MCG/ACT AERO nasal inhaler Place 2 sprays into the nose daily. Patient not taking: Reported on 01/13/2018 11/14/16 03/20/20  Waynetta Pean, PA-C      Allergies    Penicillins and Zyrtec [cetirizine]    Review of Systems   Review of Systems  Eyes:  Positive for visual disturbance.  Cardiovascular:  Negative for chest pain.  Gastrointestinal:  Negative for abdominal pain, nausea and vomiting.  Neurological:  Positive for headaches.    Physical Exam Updated Vital Signs BP 115/74 (BP Location: Left Arm)   Pulse 72   Temp (!) 97.5 F (36.4 C) (Oral)   Resp 18   SpO2 99%  Physical Exam Vitals and nursing note reviewed.  Constitutional:      General: He is not in acute distress.  Appearance: He is well-developed.  HENT:     Head: Normocephalic and atraumatic.  Eyes:     Conjunctiva/sclera: Conjunctivae normal.     Pupils: Pupils are equal, round, and reactive to light.     Comments: Exotropic strabismus left eye  Cardiovascular:     Rate and Rhythm: Normal rate and regular rhythm.     Heart sounds: No murmur heard. Pulmonary:     Effort: Pulmonary effort is normal. No respiratory distress.     Breath sounds: Normal breath sounds.  Abdominal:     Palpations: Abdomen is soft.     Tenderness: There is no abdominal tenderness.  Musculoskeletal:        General: No swelling.     Cervical back:  Neck supple.  Skin:    General: Skin is warm and dry.     Capillary Refill: Capillary refill takes less than 2 seconds.  Neurological:     Mental Status: He is alert.     Motor: No weakness.  Psychiatric:        Mood and Affect: Mood normal.     ED Results / Procedures / Treatments   Labs (all labs ordered are listed, but only abnormal results are displayed) Labs Reviewed  BASIC METABOLIC PANEL - Abnormal; Notable for the following components:      Result Value   Glucose, Bld 101 (*)    Creatinine, Ser 1.31 (*)    All other components within normal limits  CBC  TROPONIN I (HIGH SENSITIVITY)  TROPONIN I (HIGH SENSITIVITY)    EKG None  Radiology DG Chest 2 View  Result Date: 09/14/2022 CLINICAL DATA:  31 year old male with history of palpitations. EXAM: CHEST - 2 VIEW COMPARISON:  Chest x-ray 07/18/2022. FINDINGS: Lung volumes are normal. No consolidative airspace disease. No pleural effusions. No pneumothorax. No pulmonary nodule or mass noted. Pulmonary vasculature and the cardiomediastinal silhouette are within normal limits. IMPRESSION: No radiographic evidence of acute cardiopulmonary disease. Electronically Signed   By: Vinnie Langton M.D.   On: 09/14/2022 11:34    Procedures Procedures    Medications Ordered in ED Medications  ketorolac (TORADOL) 15 MG/ML injection 15 mg (15 mg Intravenous Given 09/14/22 1314)  dexamethasone (DECADRON) injection 4 mg (4 mg Intravenous Given 09/14/22 1313)    ED Course/ Medical Decision Making/ A&P                           Medical Decision Making Amount and/or Complexity of Data Reviewed Labs: ordered. Radiology: ordered.  Risk Prescription drug management.   This patient presents to the ED for concern of headache and chest pain, this involves an extensive number of treatment options, and is a complaint that carries with it a high risk of complications and morbidity.  The differential diagnosis includes migraine, tension  headache, cluster headache, and others; chest pain differential includes dysrhythmia, ACS, anxiety, PE, others   Co morbidities that complicate the patient evaluation  History of recent color vision changes and headaches   Additional history obtained:  External records from outside source obtained and reviewed including emergency department notes from earlier this month documenting previous visit for similar complaints and follow-up visit with family medicine on September 21 with the patient was evaluated and treated for anxiety and a fluorescein exam was normal.  That note documented that the patient has a plan to follow-up with ophthalmology this week.   Lab Tests:  I Ordered, and personally interpreted labs.  The pertinent results include: Unremarkable BMP, CBC, negative troponin   Imaging Studies ordered:  I ordered imaging studies including chest x-ray I independently visualized and interpreted imaging which showed no active cardiopulmonary disease I agree with the radiologist interpretation I considered head CT but the patient's headache is not the worst of his life, not sudden in onset.  I see no indication for emergent head CT.   Cardiac Monitoring: / EKG:  The patient was maintained on a cardiac monitor.  I personally viewed and interpreted the cardiac monitored which showed an underlying rhythm of: Sinus rhythm    Problem List / ED Course / Critical interventions / Medication management  I ordered medication including Toradol and Decadron for headache Reevaluation of the patient after these medicines showed that the patient improved I have reviewed the patients home medicines and have made adjustments as needed   Test / Admission - Considered:  The patient has no signs of ACS on workup. Very low suspicion of any cardiac etiology at this time. The patient does have documented anxiety, and I feel that there may be a link between his feeling of palpitations and his  anxiety levels.  The patient's headache improved significantly with medication. Suspect that the reported visual disturbances may be aura prior to migraine. There is no indication at this time for head CT. The patient has ophthalmology follow up scheduled for this week. If no definitive diagnosis at that time, patient may need neurology follow up. Recommend that the patient discuss these concerns with his primary care.   There is no indication for admission at this time. Patient may discharge home.         Final Clinical Impression(s) / ED Diagnoses Final diagnoses:  Headache disorder  Palpitations    Rx / DC Orders ED Discharge Orders     None         Ronny Bacon 09/14/22 1442    Blanchie Dessert, MD 09/17/22 1324

## 2022-09-14 NOTE — Discharge Instructions (Signed)
You were seen today for a headache which was preceded by a visual disturbance.  These may be migraines.  Please follow-up with your primary care as needed for further evaluation of your headaches.  Please keep the appointment with ophthalmology that is scheduled for this week to evaluate your vision.  If you develop any life-threatening conditions please return to the emergency department

## 2022-09-19 DIAGNOSIS — F4010 Social phobia, unspecified: Secondary | ICD-10-CM | POA: Diagnosis not present

## 2022-09-22 DIAGNOSIS — L280 Lichen simplex chronicus: Secondary | ICD-10-CM | POA: Diagnosis not present

## 2022-09-23 ENCOUNTER — Other Ambulatory Visit (HOSPITAL_COMMUNITY): Payer: Self-pay

## 2022-09-26 DIAGNOSIS — F4010 Social phobia, unspecified: Secondary | ICD-10-CM | POA: Diagnosis not present

## 2022-10-02 ENCOUNTER — Other Ambulatory Visit (HOSPITAL_COMMUNITY): Payer: Self-pay

## 2022-10-02 DIAGNOSIS — H5213 Myopia, bilateral: Secondary | ICD-10-CM | POA: Diagnosis not present

## 2022-10-03 DIAGNOSIS — F4010 Social phobia, unspecified: Secondary | ICD-10-CM | POA: Diagnosis not present

## 2022-10-13 ENCOUNTER — Ambulatory Visit (INDEPENDENT_AMBULATORY_CARE_PROVIDER_SITE_OTHER): Payer: Medicaid Other | Admitting: Family Medicine

## 2022-10-13 ENCOUNTER — Encounter: Payer: Self-pay | Admitting: Family Medicine

## 2022-10-13 VITALS — BP 118/75 | HR 64 | Ht 71.0 in | Wt 215.2 lb

## 2022-10-13 DIAGNOSIS — H539 Unspecified visual disturbance: Secondary | ICD-10-CM | POA: Diagnosis present

## 2022-10-13 DIAGNOSIS — K219 Gastro-esophageal reflux disease without esophagitis: Secondary | ICD-10-CM

## 2022-10-13 NOTE — Progress Notes (Signed)
    SUBJECTIVE:   CHIEF COMPLAINT / HPI:  Chief Complaint  Patient presents with   Referral    Neurology    Patient seen today in follow-up for episodic visual changes.  He has been evaluated for this issue multiple times either in the ED or with myself.  He reports he has had episodic palpitations associated with headache/tense feeling in his head (feels like there is "air compression" in his head) which is accompanied by visual changes/abnormal color saturation.  He feels his color saturation is off with his vision as if he has a "green filter".  He also often confuses his dark fluids for purple.  These episodes tend to occur more so at night and will sometimes wake him up from sleep, occurring a few times a week and lasts a few minutes.  He recently saw the eye doctor and was recommended to be evaluated by neurology.  He is requesting a referral for neurology.  Taking Gaviscon, omeprazole, famotidine, and gas pills for acid reflux.  PERTINENT  PMH / PSH: TBI, anxiety, GERD  Patient Care Team: [name], MD as PCP - General (Family Medicine)   OBJECTIVE:   BP 118/75   Pulse 64   Ht '5\' 11"'$  (1.803 m)   Wt 215 lb 4 oz (97.6 kg)   SpO2 100%   BMI 30.02 kg/m   Physical Exam Constitutional:      General: He is not in acute distress. Eyes:     Pupils: Pupils are equal, round, and reactive to light.     Comments: Fixed exotropic strabismus left eye  Cardiovascular:     Rate and Rhythm: Normal rate and regular rhythm.  Pulmonary:     Effort: Pulmonary effort is normal. No respiratory distress.     Breath sounds: Normal breath sounds.  Neurological:     Mental Status: He is alert.     Cranial Nerves: No cranial nerve deficit.     Motor: No weakness.         10/13/2022    2:30 PM  Depression screen PHQ 2/9  Decreased Interest 2  Down, Depressed, Hopeless 2  PHQ - 2 Score 4  Altered sleeping 2  Tired, decreased energy 3  Change in appetite 0  Feeling bad or failure  about yourself  0  Trouble concentrating 2  Moving slowly or fidgety/restless 0  Suicidal thoughts 0  PHQ-9 Score 11     {Show previous vital signs (optional):23777}    ASSESSMENT/PLAN:   Vision changes Ongoing episodic vision changes associated with palpitations and headache/abnormal head sensation.  Wonder if there is a anxiety/stress component.  Unusual constellation of symptoms. - referral to neurology  Gastroesophageal reflux disease without esophagitis GERD food triggers handout provided    Return if symptoms worsen or fail to improve.   [name], MD [place]

## 2022-10-13 NOTE — Patient Instructions (Addendum)
It was nice seeing you today!  I am referring you to a neurologist.  Stay well, Cody Button, MD [place] [phone]  --  Make sure to check out at the front desk before you leave today.  Please arrive at least 15 minutes prior to your scheduled appointments.  If you had blood work today, I will send you a MyChart message or a letter if results are normal. Otherwise, I will give you a call.  If you had a referral placed, they will call you to set up an appointment. Please give [place] a call if you don't hear back in the next 2 weeks.  If you need additional refills before your next appointment, please call your pharmacy first.

## 2022-10-13 NOTE — Assessment & Plan Note (Signed)
GERD food triggers handout provided

## 2022-10-17 DIAGNOSIS — F4010 Social phobia, unspecified: Secondary | ICD-10-CM | POA: Diagnosis not present

## 2022-10-24 ENCOUNTER — Other Ambulatory Visit (HOSPITAL_COMMUNITY): Payer: Self-pay

## 2022-10-24 DIAGNOSIS — F4010 Social phobia, unspecified: Secondary | ICD-10-CM | POA: Diagnosis not present

## 2022-10-27 ENCOUNTER — Other Ambulatory Visit (HOSPITAL_COMMUNITY): Payer: Self-pay

## 2022-10-27 DIAGNOSIS — L280 Lichen simplex chronicus: Secondary | ICD-10-CM | POA: Diagnosis not present

## 2022-10-27 MED ORDER — TRIAMCINOLONE ACETONIDE 0.1 % EX OINT
TOPICAL_OINTMENT | CUTANEOUS | 0 refills | Status: DC
  Filled 2022-10-27: qty 30, 28d supply, fill #0

## 2022-10-29 ENCOUNTER — Other Ambulatory Visit (HOSPITAL_COMMUNITY): Payer: Self-pay

## 2022-10-31 DIAGNOSIS — F4010 Social phobia, unspecified: Secondary | ICD-10-CM | POA: Diagnosis not present

## 2022-11-05 ENCOUNTER — Ambulatory Visit: Payer: Medicaid Other | Attending: Neurology

## 2022-11-05 ENCOUNTER — Ambulatory Visit (INDEPENDENT_AMBULATORY_CARE_PROVIDER_SITE_OTHER): Payer: Medicaid Other | Admitting: Neurology

## 2022-11-05 ENCOUNTER — Encounter: Payer: Self-pay | Admitting: Neurology

## 2022-11-05 VITALS — BP 118/75 | HR 61 | Ht 71.0 in | Wt 212.0 lb

## 2022-11-05 DIAGNOSIS — G4489 Other headache syndrome: Secondary | ICD-10-CM | POA: Diagnosis not present

## 2022-11-05 DIAGNOSIS — H539 Unspecified visual disturbance: Secondary | ICD-10-CM | POA: Diagnosis not present

## 2022-11-05 DIAGNOSIS — R002 Palpitations: Secondary | ICD-10-CM

## 2022-11-05 DIAGNOSIS — R000 Tachycardia, unspecified: Secondary | ICD-10-CM

## 2022-11-05 NOTE — Progress Notes (Signed)
[facility]  PATIENT: Cody [name] DOB: Aug 15, 1991  REFERRING DOCTOR OR PCP: Littie Deeds MD; Acquanetta Belling, MD SOURCE: Patient, notes from primary care, notes from emergency room, laboratory and imaging reports, EKG and report, CT scan reviewed  _________________________________   HISTORICAL  CHIEF COMPLAINT:  Chief Complaint  Patient presents with   New Patient (Initial Visit)    Pt in room #10 and alone. Pt here today to discuss his vision changes. Pt state woke up with his heart racing and he notice his vision was blurry. Pt states he was seen mix colors and his speak was slurring. Pt states he felt pressure in his head.    HISTORY OF PRESENT ILLNESS:  I had the pleasure of seeing patient, Cody [name], at [facility] for neurologic consultation regarding his headache and visual changes  He is a 31 year old man who woke up with blurry vision and a pressure sensation in his vision.  Colors looked strange (blue was over-saturated / purple). His speech was also slurred.     He felt he had heart palpitations. Symptoms started in early-September and e note she masturbated the night before and ten fell asleep and woke up with the symptoms    Slurred speech improved within a day but vision took 6-7 weeks.  He is almost back to baseline.  His HA improved over a few days and he has had a couple milder HA in last month   He does not think he had ataxia, numbness or weakness.      He went to the [facility] ED 08/27/2022 and 09/14/2022.  Notes were reviewed.  EKG and cardiac labs were fine.Marland Kitchen    He saw his ophthalmologist a month ago and no cause of his symptoms .  VF testing was reportedly fine.      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    He developed a left exotropia at age 34.   He feels vision worsened over just a couple months and then he had the exotropia.  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  REVIEW OF SYSTEMS: Constitutional: No fevers, chills, sweats, or change in appetite Eyes: No visual changes, double vision, eye pain.  Left esotropia Ear, nose and throat: No hearing loss, ear pain, nasal congestion, sore throat Cardiovascular: No chest pain, palpitations Respiratory:  No shortness of breath at rest or with exertion.   No wheezes GastrointestinaI: No nausea, vomiting, diarrhea, abdominal pain, fecal incontinence Genitourinary:  No dysuria, urinary retention or frequency.  No nocturia. Musculoskeletal:  No neck pain, back pain Integumentary: No rash, pruritus, skin lesions Neurological: as above Psychiatric: No depression at this time.  No anxiety Endocrine: No palpitations, diaphoresis, change in appetite, change in weigh or increased thirst Hematologic/Lymphatic:  No anemia, purpura, petechiae. Allergic/Immunologic: No itchy/runny eyes, nasal congestion, recent allergic reactions, rashes  ALLERGIES: Allergies  Allergen Reactions   Penicillins Other (See Comments)    Has patient had a PCN reaction causing immediate rash, facial/tongue/throat swelling, SOB or lightheadedness with hypotension: No Has patient had a PCN reaction causing severe rash involving mucus membranes or skin necrosis: No Has patient had a PCN reaction that required hospitalization: No Has patient had a PCN reaction occurring within the last 10 years: No If all of the above answers are "NO", then may proceed with Cephalosporin use.   per patient "I  took it as a kid and I almost died"  Pt    Zyrtec [Cetirizine]     "Heart speeds up"    HOME MEDICATIONS:  Current Outpatient Medications:    escitalopram (LEXAPRO) 5 MG tablet, Take 1 tablet (5 mg total) by mouth daily., Disp: 30 tablet, Rfl: 3   famotidine (PEPCID) 20 MG tablet, Take 1 tablet (20 mg total) by mouth 2 (two) times daily., Disp: 90 tablet, Rfl: 3   fluticasone (FLONASE) 50 MCG/ACT nasal spray, Place 2  sprays into both nostrils daily., Disp: 16 g, Rfl: 0   lidocaine (HM LIDOCAINE PATCH) 4 %, Place 1 patch onto the skin daily., Disp: 15 patch, Rfl: 0   loratadine (CLARITIN) 10 MG tablet, Take 1 tablet (10 mg total) by mouth daily., Disp: 30 tablet, Rfl: 11   omeprazole (PRILOSEC) 20 MG capsule, Take 1 capsule (20 mg total) by mouth daily., Disp: 30 capsule, Rfl: 3   polyethylene glycol (MIRALAX) 17 g packet, Take 17 g by mouth daily., Disp: 30 each, Rfl: 3   triamcinolone cream (KENALOG) 0.1 %, Apply 1 application topically 2 (two) times daily., Disp: 80 g, Rfl: 1   triamcinolone ointment (KENALOG) 0.1 %, Apply topically to the affected area two times daily, Disp: 30 g, Rfl: 0   PROAIR HFA 108 (90 Base) MCG/ACT inhaler, INHALE 2 PUFFS INTO THE LUNGS EVERY 4 (FOUR) HOURS AS NEEDED FOR UP TO 30 DAYS FOR SHORTNESS OF BREATH., Disp: 8.5 g, Rfl: 0  PAST MEDICAL HISTORY: Past Medical History:  Diagnosis Date   Anxiety    Asthma    childhood   GERD (gastroesophageal reflux disease)    Vitamin D deficiency     PAST SURGICAL HISTORY: Past Surgical History:  Procedure Laterality Date   CIRCUMCISION N/A 03/23/2013   Procedure: Circumcision  ;  Surgeon: Sebastian Ache, MD;  Location: [facility];  Service: Urology;  Laterality: N/A;  with block    FAMILY HISTORY: Family History  Problem Relation Age of Onset   GER disease Mother     SOCIAL HISTORY: Social History   Socioeconomic History   Marital status: Single    Spouse name: Not on file   Number of children: Not on file   Years of education: Not on file   Highest education level: Not on file  Occupational History   Not on file  Tobacco Use   Smoking status: Never   Smokeless tobacco: Never  Vaping Use   Vaping Use: Never used  Substance and Sexual Activity   Alcohol use: No   Drug use: No   Sexual activity: Not on file  Other Topics Concern   Not on file  Social History Narrative   Not on file   Social  Determinants of Health   Financial Resource Strain: Not on file  Food Insecurity: Not on file  Transportation Needs: Not on file  Physical Activity: Not on file  Stress: Not on file  Social Connections: Not on file  Intimate Partner Violence: Not on file       PHYSICAL EXAM  Vitals:   11/05/22 1526  BP: 118/75  Pulse: 61  Weight: 212 lb (96.2 kg)  Height: 5\' 11"  (1.803 m)    Body mass index is 29.57 kg/m.  VA: OU 20/40 OS 20/50 OD 20/40 Colors slightly less saturated OS  General: The patient is well-developed and well-nourished and in no acute distress  HEENT:  Head is [place]/AT.  Sclera are anicteric.  Funduscopic exam shows normal optic discs and retinal vessels.  Neck: No carotid bruits are noted.  The neck is mildly tender over the occiput..  Cardiovascular: The heart has a regular rate and rhythm with a normal S1 and S2. There were no murmurs, gallops or rubs.    Skin: Extremities are without rash or  edema.  Musculoskeletal:  Back is nontender  Neurologic Exam  Mental status: The patient is alert and oriented x 3 at the time of the examination. The patient has apparent normal recent and remote memory, with an apparently normal attention span and concentration ability.   Speech is normal.  Cranial nerves: With forward gaze, the left eye is abducted.  It returns to midline upon covering the right eye.  With both eyes, he has trouble completely abducting the left eye but is able to do so with the right eye covered.  Pupils are equal, round, and reactive to light and accomodation.  Visual fields are full.  Facial symmetry is present. There is good facial sensation to soft touch bilaterally.Facial strength is normal.  Trapezius and sternocleidomastoid strength is normal. No dysarthria is noted.  The tongue is midline, and the patient has symmetric elevation of the soft palate. No obvious hearing deficits are noted.  Motor:  Muscle bulk is normal.   Tone is normal.  Strength is  5 / 5 in all 4 extremities.   Sensory: Sensory testing is intact to pinprick, soft touch and vibration sensation in all 4 extremities.  Coordination: Cerebellar testing reveals good finger-nose-finger and heel-to-shin bilaterally.  Gait and station: Station is normal.   Gait is normal. Tandem gait is normal. Romberg is negative.   Reflexes: Deep tendon reflexes are symmetric and normal bilaterally.   Plantar responses are flexor.    DIAGNOSTIC DATA (LABS, IMAGING, TESTING) - I reviewed patient records, labs, notes, testing and imaging myself where available.  Lab Results  Component Value Date   WBC 5.5 09/14/2022   HGB 15.7 09/14/2022   HCT 47.9 09/14/2022   MCV 92.6 09/14/2022   PLT 279 09/14/2022      Component Value Date/Time   NA 139 09/14/2022 1052   NA 141 03/08/2021 1516   K 4.1 09/14/2022 1052   CL 106 09/14/2022 1052   CO2 26 09/14/2022 1052   GLUCOSE 101 (H) 09/14/2022 1052   BUN 12 09/14/2022 1052   BUN 10 03/08/2021 1516   CREATININE 1.31 (H) 09/14/2022 1052   CALCIUM 9.2 09/14/2022 1052   PROT 7.4 04/05/2018 1713   ALBUMIN 4.2 04/05/2018 1713   AST 24 04/05/2018 1713   ALT 28 04/05/2018 1713   ALKPHOS 69 04/05/2018 1713   BILITOT 0.8 04/05/2018 1713   GFRNONAA >60 09/14/2022 1052   GFRAA >60 12/24/2018 1141   No results found for: "CHOL", "HDL", "LDLCALC", "LDLDIRECT", "TRIG", "CHOLHDL" Lab Results  Component Value Date   HGBA1C 5.4 07/17/2022      ASSESSMENT AND PLAN  Vision disturbance - Plan: MR BRAIN W WO CONTRAST  Other headache syndrome - Plan: MR BRAIN W WO CONTRAST  Heart palpitations - Plan: HOLTER MONITOR - 48 HOUR  Tachycardia - Plan: HOLTER MONITOR - 48 HOUR   In summary, Mr. Lagrone is a 31 year old man who had the onset of headache and visual changes in early September 2023.  This was associated with palpitations.  Additionally, there was mild slurred speech that improved within a day or 2.  The headache improved  over several days and the vision  is just now close to baseline after improving over 8 to 9 weeks.  The etiology is uncertain.  Complicating the picture, he developed left exotropia as a teenager which is unusual.  Given his age, we need to be concerned about demyelination and due to rapid heartbeat, arrhythmia as a cardioembolic source.  We will check an MRI of the brain with and without contrast to assess for demyelination and stroke.  Additionally I will have him do a 48-hour Holter monitor or equivalent to determine if there is any arrhythmia.  If the MRI is abnormal additional evaluation would likely be needed.  If an arrhythmia is detected we will consider referring to cardiology.  He will return to see [place] if either test is abnormal.  If both tests are normal, he should call if he has any new or worsening neurologic symptoms in the future.  I did not schedule follow-up but would be happy to see him back if this occurs.   Corene Resnick A. Epimenio Foot, MD, [facility] 11/05/2022, 3:49 PM Certified in Neurology, Clinical Neurophysiology, Sleep Medicine and Neuroimaging  [facility] [address], [address] [place], [place] [address] [phone]

## 2022-11-05 NOTE — Progress Notes (Unsigned)
Enrolled for [employer] to mail a ZIO XT long term holter monitor to the patients address on file.   DOD to read. 

## 2022-11-07 DIAGNOSIS — F4010 Social phobia, unspecified: Secondary | ICD-10-CM | POA: Diagnosis not present

## 2022-11-10 ENCOUNTER — Telehealth: Payer: Self-pay | Admitting: Neurology

## 2022-11-10 NOTE — Telephone Encounter (Signed)
[employer] medicaid Josem Kaufmann: [id number] exp. 11/10/22-12/25/22 sent to GI [phone]

## 2022-11-12 DIAGNOSIS — F4010 Social phobia, unspecified: Secondary | ICD-10-CM | POA: Diagnosis not present

## 2022-11-25 ENCOUNTER — Other Ambulatory Visit (HOSPITAL_COMMUNITY): Payer: Self-pay

## 2022-11-28 DIAGNOSIS — F4010 Social phobia, unspecified: Secondary | ICD-10-CM | POA: Diagnosis not present

## 2022-12-01 ENCOUNTER — Ambulatory Visit
Admission: RE | Admit: 2022-12-01 | Discharge: 2022-12-01 | Disposition: A | Discharge status: Home / Self Care | Payer: Medicaid Other | Source: Ambulatory Visit | Attending: Neurology | Admitting: Neurology

## 2022-12-01 DIAGNOSIS — H539 Unspecified visual disturbance: Secondary | ICD-10-CM | POA: Diagnosis not present

## 2022-12-01 DIAGNOSIS — G4489 Other headache syndrome: Secondary | ICD-10-CM | POA: Diagnosis not present

## 2022-12-01 MED ORDER — GADOPICLENOL 0.5 MMOL/ML IV SOLN
10.0000 mL | Freq: Once | INTRAVENOUS | Status: AC | PRN
  Administered 2022-12-01: 10 mL via INTRAVENOUS

## 2022-12-05 DIAGNOSIS — F4010 Social phobia, unspecified: Secondary | ICD-10-CM | POA: Diagnosis not present

## 2022-12-11 ENCOUNTER — Ambulatory Visit (INDEPENDENT_AMBULATORY_CARE_PROVIDER_SITE_OTHER): Payer: Medicaid Other | Admitting: Family Medicine

## 2022-12-11 ENCOUNTER — Encounter: Payer: Self-pay | Admitting: Family Medicine

## 2022-12-11 ENCOUNTER — Other Ambulatory Visit (HOSPITAL_COMMUNITY): Payer: Self-pay

## 2022-12-11 VITALS — BP 126/77 | HR 58 | Ht 71.0 in | Wt 211.2 lb

## 2022-12-11 DIAGNOSIS — K090 Developmental odontogenic cysts: Secondary | ICD-10-CM | POA: Diagnosis not present

## 2022-12-11 DIAGNOSIS — G43109 Migraine with aura, not intractable, without status migrainosus: Secondary | ICD-10-CM | POA: Diagnosis not present

## 2022-12-11 MED ORDER — DOXYCYCLINE HYCLATE 100 MG PO TABS
100.0000 mg | ORAL_TABLET | Freq: Two times a day (BID) | ORAL | 0 refills | Status: AC
  Filled 2022-12-11: qty 10, 5d supply, fill #0

## 2022-12-11 MED ORDER — TOPIRAMATE 25 MG PO TABS
ORAL_TABLET | ORAL | 0 refills | Status: AC
  Filled 2022-12-11: qty 70, 28d supply, fill #0

## 2022-12-11 NOTE — Patient Instructions (Signed)
For the spots on your face I am going to prescribe an antibiotic called Doxycyline to take twice daily.  We can restart the topiramate (Topamax) with a titration schedule to get up to the normal dose.  25 mg orally once daily in the evening for first week 25 mg twice daily for second week 25 mg in the morning and 50 mg in the evening for third week and then 50 mg twice daily

## 2022-12-11 NOTE — Progress Notes (Signed)
    SUBJECTIVE:   CHIEF COMPLAINT / HPI:   Migraines - Has been seen by neurology  - MRI brain was normal on 12/11 - Did Holter monitor without any concerns - Hx of migraines at age 31 - Has issues with photophobia and had tinnitus  - Thinks he has migraines every few days out of nowhere - Prodrome symptoms of everything looking brighter - Treatment at home consistent of Ibuprofen, was using Excedrin - When he wax 15 he used Topamax   Hair Bumps - In area of shaving, scratched the bumps - Ibuprofen was helping with the pain - Started a few days ago - Denies drainage  PERTINENT  PMH / PSH: Reviewed  OBJECTIVE:   BP 126/77   Pulse (!) 58   Ht '5\' 11"'$  (1.803 m)   Wt 211 lb 3.2 oz (95.8 kg)   SpO2 100%   BMI 29.46 kg/m   General: NAD, well-appearing, well-nourished Respiratory: No respiratory distress, breathing comfortably, able to speak in full sentences Skin: warm and dry, no rashes noted on exposed skin. Small cyst noted on the right jaw line with overlaid scabbing Psych: Appropriate affect and mood  ASSESSMENT/PLAN:   Migraine with aura and without status migrainosus, not intractable History of migraines in patient with what sounds like recurrence with description of current headaches. Previously controlled with Topamax. Has prodrome symptoms that include changes in brightness in the room that are suspicious for an aura. Has been evaluated by neurology and had negative MRI. Defer abortive treatment at this time as he is having several per week and would prefer to avoid Triptan overuse.  - Topamax '25mg'$  daily with titration schedule up to '50mg'$  BID - Follow-up in 1 month    Follicular cyst of jaw Palpated on examination and is causing patient significant pain and distress for patient. Given the appearance of cyst and concern for possible infection, will treat with oral antibiotics as unsure if a topical would penetrate. - Trial of doxycyline '100mg'$  BID x5 days - Follow-up  if no improvement  - can consider Mupirocin if superficial in the future.   Rise Patience, [place]

## 2022-12-11 NOTE — Assessment & Plan Note (Addendum)
History of migraines in patient with what sounds like recurrence with description of current headaches. Previously controlled with Topamax. Has prodrome symptoms that include changes in brightness in the room that are suspicious for an aura. Has been evaluated by neurology and had negative MRI. Defer abortive treatment at this time as he is having several per week and would prefer to avoid Triptan overuse.  - Topamax '25mg'$  daily with titration schedule up to '50mg'$  BID - Follow-up in 1 month

## 2022-12-12 ENCOUNTER — Ambulatory Visit: Payer: Medicaid Other | Admitting: Family Medicine

## 2022-12-12 DIAGNOSIS — F4010 Social phobia, unspecified: Secondary | ICD-10-CM | POA: Diagnosis not present

## 2022-12-17 ENCOUNTER — Other Ambulatory Visit (HOSPITAL_COMMUNITY): Payer: Self-pay

## 2022-12-18 ENCOUNTER — Other Ambulatory Visit (HOSPITAL_COMMUNITY): Payer: Self-pay

## 2022-12-19 DIAGNOSIS — F4010 Social phobia, unspecified: Secondary | ICD-10-CM | POA: Diagnosis not present

## 2022-12-23 DIAGNOSIS — F4010 Social phobia, unspecified: Secondary | ICD-10-CM | POA: Diagnosis not present

## 2022-12-26 ENCOUNTER — Telehealth: Payer: Self-pay

## 2022-12-26 NOTE — Telephone Encounter (Signed)
Patient calls nurse line regarding side effects to Topiramate.   He has been taking this since Monday, 12/22/22. He took for three days and then began having fatigue, nausea and left eye pain. No visual changes. Last dose of medication was yesterday.   No current chest pain, shortness of breath, or difficulty with swallowing.   Patient is asking to be prescribed alternative medication for migraines.   Will forward to prescriber. ED precautions given.   Talbot Grumbling, RN

## 2022-12-29 ENCOUNTER — Telehealth: Payer: Self-pay | Admitting: Neurology

## 2022-12-29 ENCOUNTER — Other Ambulatory Visit: Payer: Self-pay

## 2022-12-29 ENCOUNTER — Encounter: Payer: Self-pay | Admitting: Family Medicine

## 2022-12-29 MED ORDER — IMIPRAMINE HCL 25 MG PO TABS
25.0000 mg | ORAL_TABLET | Freq: Every day | ORAL | 3 refills | Status: AC
  Filled 2022-12-29: qty 30, 30d supply, fill #0

## 2022-12-29 NOTE — Telephone Encounter (Signed)
Called the patient back. He started the topiramate for migraine management. He had side effects and was told to stop. His pcp is who had prescribed that medication so he did contact them. They are wanting him to address alternative medication with Dr Felecia Shelling since amitripyline would be their 2nd recommendation and that shouldn't be taken with his anxiety med. See below phone message  Pt called into PCP- "He has been taking this since Monday, 12/22/22. He took for three days and then began having fatigue, nausea and left eye pain. No visual changes. Last dose of medication was yesterday.    No current chest pain, shortness of breath, or difficulty with swallowing.    Patient is asking to be prescribed alternative medication for migraines.   MD response, "Called patient regarding his medication, he reports that he has not taken any more doses since. Discussed the case with Dr. Nori Riis who recommends that patient be seen by Neurology as he is established with them as patient is currently taking an SSRI so we would not want to add Amitriptyline if we can avoid it. Given how often he was saying he was having the headaches, triptan overuse would be a concern. We will defer to neurology recommendations for further evaluation. Patient is aware and was given the phone number to reach out to their office."

## 2022-12-29 NOTE — Telephone Encounter (Signed)
Called patient regarding his medication, he reports that he has not taken any more doses since. Discussed the case with Dr. Nori Riis who recommends that patient be seen by Neurology as he is established with them as patient is currently taking an SSRI so we would not want to add Amitriptyline if we can avoid it. Given how often he was saying he was having the headaches, triptan overuse would be a concern. We will defer to neurology recommendations for further evaluation. Patient is aware and was given the phone number to reach out to their office.   Cody Preble, DO

## 2022-12-29 NOTE — Addendum Note (Signed)
Addended by: Darleen Crocker on: 12/29/2022 05:31 PM   Modules accepted: Orders

## 2022-12-29 NOTE — Telephone Encounter (Signed)
Called the patient and advised of the information below and he is ok with this plan. Med will be sent to the pharmacy for the pt. Pt verbalized understanding.  Start imipramine 25 mg 1 p.o. nightly.  It is similar to amitriptyline but I like to better due to less side effects.  It will flag an interaction with the SSRI but at the dosages I am using it is safe to use

## 2022-12-29 NOTE — Telephone Encounter (Signed)
Pt is calling. Stated he would like to talk to nurse about changing medication topiramate (TOPAMAX) 25 MG tablet. Stated this medication is causing him body pain and couldn't stand up with this medication. Pt is requesting a call back from the nurse.

## 2022-12-30 ENCOUNTER — Other Ambulatory Visit (HOSPITAL_COMMUNITY): Payer: Self-pay

## 2023-01-01 ENCOUNTER — Other Ambulatory Visit (HOSPITAL_COMMUNITY): Payer: Self-pay

## 2023-01-02 DIAGNOSIS — F4010 Social phobia, unspecified: Secondary | ICD-10-CM | POA: Diagnosis not present

## 2023-01-07 ENCOUNTER — Other Ambulatory Visit (HOSPITAL_COMMUNITY): Payer: Self-pay

## 2023-01-09 DIAGNOSIS — F4010 Social phobia, unspecified: Secondary | ICD-10-CM | POA: Diagnosis not present

## 2023-01-23 DIAGNOSIS — F4010 Social phobia, unspecified: Secondary | ICD-10-CM | POA: Diagnosis not present

## 2023-01-30 DIAGNOSIS — F4010 Social phobia, unspecified: Secondary | ICD-10-CM | POA: Diagnosis not present

## 2023-02-06 ENCOUNTER — Other Ambulatory Visit: Payer: Self-pay | Admitting: Family Medicine

## 2023-02-06 ENCOUNTER — Other Ambulatory Visit (HOSPITAL_COMMUNITY): Payer: Self-pay

## 2023-02-06 DIAGNOSIS — F4010 Social phobia, unspecified: Secondary | ICD-10-CM | POA: Diagnosis not present

## 2023-02-06 MED ORDER — OMEPRAZOLE 20 MG PO CPDR
20.0000 mg | DELAYED_RELEASE_CAPSULE | Freq: Every day | ORAL | 3 refills | Status: DC
  Filled 2023-02-06: qty 30, 30d supply, fill #0
  Filled 2023-11-23: qty 30, 30d supply, fill #1
  Filled 2023-12-19: qty 30, 30d supply, fill #2
  Filled 2024-01-20: qty 30, 30d supply, fill #3

## 2023-02-13 DIAGNOSIS — F4010 Social phobia, unspecified: Secondary | ICD-10-CM | POA: Diagnosis not present

## 2023-02-16 ENCOUNTER — Other Ambulatory Visit (HOSPITAL_COMMUNITY): Payer: Self-pay

## 2023-02-20 DIAGNOSIS — F4010 Social phobia, unspecified: Secondary | ICD-10-CM | POA: Diagnosis not present

## 2023-03-03 ENCOUNTER — Other Ambulatory Visit (HOSPITAL_COMMUNITY): Payer: Self-pay

## 2023-03-03 ENCOUNTER — Other Ambulatory Visit: Payer: Self-pay | Admitting: Family Medicine

## 2023-03-03 MED ORDER — FLUTICASONE PROPIONATE 50 MCG/ACT NA SUSP
2.0000 | Freq: Every day | NASAL | 0 refills | Status: DC
  Filled 2023-03-03: qty 16, 30d supply, fill #0

## 2023-03-06 DIAGNOSIS — F4010 Social phobia, unspecified: Secondary | ICD-10-CM | POA: Diagnosis not present

## 2023-03-10 ENCOUNTER — Ambulatory Visit: Payer: Medicaid Other | Admitting: Student

## 2023-03-19 ENCOUNTER — Other Ambulatory Visit (HOSPITAL_COMMUNITY): Payer: Self-pay

## 2023-04-03 DIAGNOSIS — F4010 Social phobia, unspecified: Secondary | ICD-10-CM | POA: Diagnosis not present

## 2023-04-06 ENCOUNTER — Ambulatory Visit: Payer: Medicaid Other | Admitting: Student

## 2023-04-10 DIAGNOSIS — F4010 Social phobia, unspecified: Secondary | ICD-10-CM | POA: Diagnosis not present

## 2023-04-17 ENCOUNTER — Other Ambulatory Visit: Payer: Self-pay

## 2023-04-17 DIAGNOSIS — F4010 Social phobia, unspecified: Secondary | ICD-10-CM | POA: Diagnosis not present

## 2023-04-21 ENCOUNTER — Other Ambulatory Visit (HOSPITAL_COMMUNITY): Payer: Self-pay

## 2023-04-21 ENCOUNTER — Other Ambulatory Visit: Payer: Self-pay

## 2023-04-21 DIAGNOSIS — F419 Anxiety disorder, unspecified: Secondary | ICD-10-CM

## 2023-04-22 ENCOUNTER — Other Ambulatory Visit (HOSPITAL_COMMUNITY): Payer: Self-pay

## 2023-04-22 MED ORDER — ESCITALOPRAM OXALATE 5 MG PO TABS
5.0000 mg | ORAL_TABLET | Freq: Every day | ORAL | 3 refills | Status: DC
  Filled 2023-04-22: qty 30, 30d supply, fill #0
  Filled 2023-05-20: qty 30, 30d supply, fill #1
  Filled 2023-10-26: qty 30, 30d supply, fill #0
  Filled 2023-11-23: qty 30, 30d supply, fill #1

## 2023-04-23 ENCOUNTER — Other Ambulatory Visit (HOSPITAL_COMMUNITY): Payer: Self-pay

## 2023-04-24 DIAGNOSIS — F4010 Social phobia, unspecified: Secondary | ICD-10-CM | POA: Diagnosis not present

## 2023-05-01 DIAGNOSIS — F4010 Social phobia, unspecified: Secondary | ICD-10-CM | POA: Diagnosis not present

## 2023-05-08 DIAGNOSIS — F4010 Social phobia, unspecified: Secondary | ICD-10-CM | POA: Diagnosis not present

## 2023-05-14 ENCOUNTER — Other Ambulatory Visit: Payer: Self-pay | Admitting: Gastroenterology

## 2023-05-14 ENCOUNTER — Ambulatory Visit
Admission: RE | Admit: 2023-05-14 | Discharge: 2023-05-14 | Disposition: A | Discharge status: Home / Self Care | Payer: Medicaid Other | Source: Ambulatory Visit | Attending: Gastroenterology | Admitting: Gastroenterology

## 2023-05-14 DIAGNOSIS — K5900 Constipation, unspecified: Secondary | ICD-10-CM

## 2023-05-14 DIAGNOSIS — R140 Abdominal distension (gaseous): Secondary | ICD-10-CM

## 2023-05-15 DIAGNOSIS — F4010 Social phobia, unspecified: Secondary | ICD-10-CM | POA: Diagnosis not present

## 2023-05-20 ENCOUNTER — Other Ambulatory Visit (HOSPITAL_COMMUNITY): Payer: Self-pay

## 2023-05-21 ENCOUNTER — Encounter: Payer: Self-pay | Admitting: Student

## 2023-05-21 ENCOUNTER — Other Ambulatory Visit (HOSPITAL_COMMUNITY): Payer: Self-pay

## 2023-05-21 ENCOUNTER — Ambulatory Visit (INDEPENDENT_AMBULATORY_CARE_PROVIDER_SITE_OTHER): Payer: Medicaid Other | Admitting: Student

## 2023-05-21 VITALS — BP 122/68 | HR 73 | Ht 71.0 in | Wt 208.0 lb

## 2023-05-21 DIAGNOSIS — N5312 Painful ejaculation: Secondary | ICD-10-CM | POA: Diagnosis present

## 2023-05-21 LAB — POCT URINALYSIS DIP (MANUAL ENTRY)
Bilirubin, UA: NEGATIVE
Blood, UA: NEGATIVE
Glucose, UA: NEGATIVE mg/dL
Leukocytes, UA: NEGATIVE
Nitrite, UA: NEGATIVE
Protein Ur, POC: 30 mg/dL — AB
Spec Grav, UA: 1.025 (ref 1.010–1.025)
Urobilinogen, UA: 1 E.U./dL
pH, UA: 7 (ref 5.0–8.0)

## 2023-05-21 LAB — POCT UA - MICROSCOPIC ONLY
Bacteria, U Microscopic: NONE SEEN
WBC, Ur, HPF, POC: NONE SEEN (ref 0–5)

## 2023-05-21 NOTE — Assessment & Plan Note (Signed)
Pain after ejaculating for the past year. Also has possible retrograde ejaculation with the feeling of ejaculate being stuck in penis. UA positive for protein but does not appear infected. Low concern for STI given not sexually active since 2018. Discussed SSRI could have some sexual side effects. Given length of symptoms and impact on his life-will refer to urology for further workup. - Ambulatory referral to Urology - 1 month f/u with PCP for urine microalbumin/creatinine given 30 protein in UA

## 2023-05-21 NOTE — Patient Instructions (Signed)
It was great to see you! Thank you for allowing me to participate in your care!   I recommend that you always bring your medications to each appointment as this makes it easy to ensure we are on the correct medications and helps [place] not miss when refills are needed.  Our plans for today:  - We will get a UA today - I am placing urology referral today  We are checking some labs today, I will call you if they are abnormal will send you a MyChart message or a letter if they are normal.  If you do not hear about your labs in the next 2 weeks please let us know.  Take care and seek immediate care sooner if you develop any concerns. Please remember to show up 15 minutes before your scheduled appointment time!  Levin Erp, MD [facility]

## 2023-05-21 NOTE — Progress Notes (Signed)
    SUBJECTIVE:   CHIEF COMPLAINT / HPI: Pain after ejaculation  Over the past year has been having these symptoms Pelvic pain and irritation Urethral pain after masturbating To the point where tight/fitted clothing around pelvis is uncomfortable Has pain after ejaculation-he feels like his ejaculate gets caught in penis Certain positions makes this worse Not sexually active with anyone since 2018 G/C in 2023 was negative Many years ago had chlamydia but this was treated and had no recurrence No discharge from penis Sometimes takes Azo pills and this goes away  No pain when peeing and peeing normally Says he got a UA  which was negative for UTI No trauma to penis or bleeding  He has not noticed any sores on his penis but does have pearly colored bumps that have been previously noted in 2022 thought to be benign He does take lexapro 5 mg daily, unsure if he takes imipramine He is very bothered and embarrassed by this problem and would like to figure out what is going on which is why he came in today  PERTINENT  PMH / PSH: TBI  OBJECTIVE:   BP 122/68   Pulse 73   Ht 5\' 11"  (1.803 m)   Wt 208 lb (94.3 kg)   SpO2 99%   BMI 29.01 kg/m   General: Well appearing, NAD, awake, alert, responsive to questions CV: Regular rate and rhythm no murmurs rubs or gallops Respiratory: Clear to ausculation bilaterally, no wheezes rales or crackles, chest rises symmetrically,  no increased work of breathing Abdomen: Soft, mildly tender suprapubic region, non-distended, normoactive bowel sounds  GU: deferred Extremities: Moves upper and lower extremities freely ASSESSMENT/PLAN:   Pain with ejaculation Pain after ejaculating for the past year. Also has possible retrograde ejaculation with the feeling of ejaculate being stuck in penis. UA positive for protein but does not appear infected. Low concern for STI given not sexually active since 2018. Discussed SSRI could have some sexual side effects.  Given length of symptoms and impact on his life-will refer to urology for further workup. - Ambulatory referral to Urology - 1 month f/u with PCP for urine microalbumin/creatinine given 30 protein in UA   Levin Erp, MD [facility]

## 2023-05-22 DIAGNOSIS — F4010 Social phobia, unspecified: Secondary | ICD-10-CM | POA: Diagnosis not present

## 2023-06-02 DIAGNOSIS — F4010 Social phobia, unspecified: Secondary | ICD-10-CM | POA: Diagnosis not present

## 2023-06-05 DIAGNOSIS — F4010 Social phobia, unspecified: Secondary | ICD-10-CM | POA: Diagnosis not present

## 2023-06-12 DIAGNOSIS — F4010 Social phobia, unspecified: Secondary | ICD-10-CM | POA: Diagnosis not present

## 2023-06-16 DIAGNOSIS — F4010 Social phobia, unspecified: Secondary | ICD-10-CM | POA: Diagnosis not present

## 2023-06-19 DIAGNOSIS — F4010 Social phobia, unspecified: Secondary | ICD-10-CM | POA: Diagnosis not present

## 2023-07-01 ENCOUNTER — Other Ambulatory Visit (HOSPITAL_COMMUNITY): Payer: Self-pay

## 2023-07-01 DIAGNOSIS — K5900 Constipation, unspecified: Secondary | ICD-10-CM | POA: Diagnosis not present

## 2023-07-01 MED ORDER — LINZESS 72 MCG PO CAPS
72.0000 ug | ORAL_CAPSULE | Freq: Every day | ORAL | 3 refills | Status: DC
  Filled 2023-07-01 – 2023-10-26 (×2): qty 90, 90d supply, fill #0
  Filled 2024-04-14: qty 90, 90d supply, fill #1

## 2023-07-02 ENCOUNTER — Other Ambulatory Visit (HOSPITAL_COMMUNITY): Payer: Self-pay

## 2023-07-03 DIAGNOSIS — F4010 Social phobia, unspecified: Secondary | ICD-10-CM | POA: Diagnosis not present

## 2023-07-10 DIAGNOSIS — F4010 Social phobia, unspecified: Secondary | ICD-10-CM | POA: Diagnosis not present

## 2023-07-15 ENCOUNTER — Other Ambulatory Visit (HOSPITAL_COMMUNITY): Payer: Self-pay

## 2023-07-15 DIAGNOSIS — F4010 Social phobia, unspecified: Secondary | ICD-10-CM | POA: Diagnosis not present

## 2023-07-28 NOTE — Progress Notes (Deleted)
    SUBJECTIVE:   CHIEF COMPLAINT / HPI:   ***  UACR for prior proteinuria  PERTINENT  PMH / PSH: ***  OBJECTIVE:   There were no vitals taken for this visit.  ***  ASSESSMENT/PLAN:   No problem-specific Assessment & Plan notes found for this encounter.     Lincoln Brigham, MD [facility]

## 2023-07-29 ENCOUNTER — Ambulatory Visit: Payer: Medicaid Other | Admitting: Family Medicine

## 2023-08-03 ENCOUNTER — Other Ambulatory Visit: Payer: Self-pay

## 2023-08-03 ENCOUNTER — Emergency Department (HOSPITAL_COMMUNITY)
Admission: EM | Admit: 2023-08-03 | Discharge: 2023-08-03 | Disposition: A | Discharge status: Home / Self Care | Payer: Medicaid Other | Attending: Emergency Medicine | Admitting: Emergency Medicine

## 2023-08-03 ENCOUNTER — Encounter (HOSPITAL_COMMUNITY): Payer: Self-pay

## 2023-08-03 DIAGNOSIS — R2242 Localized swelling, mass and lump, left lower limb: Secondary | ICD-10-CM | POA: Diagnosis not present

## 2023-08-03 DIAGNOSIS — R4582 Worries: Secondary | ICD-10-CM | POA: Diagnosis not present

## 2023-08-03 DIAGNOSIS — Z711 Person with feared health complaint in whom no diagnosis is made: Secondary | ICD-10-CM

## 2023-08-03 NOTE — ED Triage Notes (Signed)
Pt came in via POV d/t a knot that he found on his thigh & wraps around the outer part of his leg & almost under his bottom. A/Ox4, denies pain.

## 2023-08-03 NOTE — ED Provider Notes (Addendum)
[place] EMERGENCY DEPARTMENT AT [facility] Provider Note   CSN: [id number] Arrival date & time: 08/03/23  1317     History  Chief Complaint  Patient presents with   Lump to Lt thing   HPI Cody [name] is a 32 y.o. male presenting for lump on his left thigh.  States he noticed it yesterday.  Felt a "knot in the midportion of his left lateral thigh.  States is not painful.  Not impacting his ambulation.  Denies any numbness or weakness in that leg.  Denies any redness or swelling around the area of concern.  HPI     Home Medications Prior to Admission medications   Medication Sig Start Date End Date Taking? Authorizing Provider  escitalopram (LEXAPRO) 5 MG tablet Take 1 tablet (5 mg total) by mouth daily. 04/22/23   Littie Deeds, MD  famotidine (PEPCID) 20 MG tablet Take 1 tablet (20 mg total) by mouth 2 (two) times daily. 07/11/21   Celedonio Savage, MD  fluticasone (FLONASE) 50 MCG/ACT nasal spray Place 2 sprays into both nostrils daily. 03/03/23   Littie Deeds, MD  imipramine (TOFRANIL) 25 MG tablet Take 1 tablet (25 mg total) by mouth at bedtime. 12/29/22   Sater, Pearletha Furl, MD  lidocaine (HM LIDOCAINE PATCH) 4 % Place 1 patch onto the skin daily. 07/18/22   Achille Rich, PA-C  linaclotide [facility]) 72 MCG capsule Take 1 capsule (72 mcg total) by mouth daily at least 30 minutes before the first meal of the day on an empty stomach 07/01/23     loratadine (CLARITIN) 10 MG tablet Take 1 tablet (10 mg total) by mouth daily. 05/07/22   Littie Deeds, MD  omeprazole (PRILOSEC) 20 MG capsule Take 1 capsule (20 mg total) by mouth daily. 02/06/23   Littie Deeds, MD  polyethylene glycol (MIRALAX) 17 g packet Take 17 g by mouth daily. 07/11/21   Cresenzo, Cyndi Lennert, MD  PROAIR HFA 108 [phone] Base) MCG/ACT inhaler INHALE 2 PUFFS INTO THE LUNGS EVERY 4 (FOUR) HOURS AS NEEDED FOR UP TO 30 DAYS FOR SHORTNESS OF BREATH. 01/23/21 01/23/22  Cheek, Tamra, PA-C  topiramate (TOPAMAX) 25 MG tablet Take 1  tablet by mouth every evening for 7 days, THEN 1 tablet 2 times daily for 7 days, THEN 1 tablet  in the morning and 2 tabs in the evening for 7 days, THEN 2 tablets  twice a day 12/11/22 01/14/23  Lilland, Alana, DO  triamcinolone cream (KENALOG) 0.1 % Apply 1 application topically 2 (two) times daily. 07/17/22   Moses Manners, MD  triamcinolone ointment (KENALOG) 0.1 % Apply topically to the affected area two times daily 10/27/22     triamcinolone (NASACORT AQ) 55 MCG/ACT AERO nasal inhaler Place 2 sprays into the nose daily. Patient not taking: Reported on 01/13/2018 11/14/16 03/20/20  Everlene Farrier, PA-C      Allergies    Penicillins and Zyrtec [cetirizine]    Review of Systems   See HPI for pertinent positives  Physical Exam Updated Vital Signs BP 107/69   Pulse (!) 52   Temp 98 F (36.7 C) (Oral)   Resp 12   Ht 5\' 11"  (1.803 m)   Wt 91.6 kg   SpO2 100%   BMI 28.17 kg/m  Physical Exam Constitutional:      Appearance: Normal appearance.  HENT:     Head: Normocephalic.     Nose: Nose normal.  Eyes:     Conjunctiva/sclera: Conjunctivae normal.  Cardiovascular:  Pulses:          Dorsalis pedis pulses are 2+ on the right side and 2+ on the left side.  Pulmonary:     Effort: Pulmonary effort is normal.  Musculoskeletal:     Left upper leg: Normal. No swelling, edema, deformity, lacerations, tenderness or bony tenderness.     Left lower leg: Normal.     Comments: Sensation in tact to the distal  left left.  No notable lumps, masses, lesions.  No areas of fluctuance, erythema, edema or induration.  Neurological:     Mental Status: He is alert.  Psychiatric:        Mood and Affect: Mood normal.     ED Results / Procedures / Treatments   Labs (all labs ordered are listed, but only abnormal results are displayed) Labs Reviewed - No data to display  EKG None  Radiology No results found.  Procedures Procedures    Medications Ordered in ED Medications - No  data to display  ED Course/ Medical Decision Making/ A&P                                 Medical Decision Making  32 year old well-appearing male presenting for lump on the left thigh.  Exam was unremarkable and does not appear to be a lump, mass, lesions.  Grossly appears normal.  Neurovascularly intact.  No evidence of trauma to the left leg.  Doubt DVT given no pain or swelling. Suspect it is the normal contour of his upper leg muscles.  Advised him to follow-up with her PCP.  Vital stable.  Discharged home in good condition.    Final Clinical Impression(s) / ED Diagnoses Final diagnoses:  Worried well    Rx / DC Orders ED Discharge Orders     None           Gareth Eagle, PA-C 08/03/23 1603    Terald Sleeper, MD 08/03/23 2226

## 2023-08-03 NOTE — Discharge Instructions (Addendum)
Evaluation of your leg was overall reassuring.  Does not appear that there is any mass, lesion or area of infection on your leg.  Recommend you follow with your PCP for reevaluation next couple days.

## 2023-08-10 DIAGNOSIS — F4010 Social phobia, unspecified: Secondary | ICD-10-CM | POA: Diagnosis not present

## 2023-08-11 ENCOUNTER — Ambulatory Visit: Payer: Medicaid Other | Admitting: Student

## 2023-08-15 DIAGNOSIS — F4010 Social phobia, unspecified: Secondary | ICD-10-CM | POA: Diagnosis not present

## 2023-08-18 DIAGNOSIS — F4010 Social phobia, unspecified: Secondary | ICD-10-CM | POA: Diagnosis not present

## 2023-08-21 DIAGNOSIS — F4010 Social phobia, unspecified: Secondary | ICD-10-CM | POA: Diagnosis not present

## 2023-08-28 DIAGNOSIS — F4010 Social phobia, unspecified: Secondary | ICD-10-CM | POA: Diagnosis not present

## 2023-09-02 ENCOUNTER — Other Ambulatory Visit (HOSPITAL_COMMUNITY): Payer: Self-pay

## 2023-09-02 ENCOUNTER — Other Ambulatory Visit: Payer: Self-pay

## 2023-09-02 ENCOUNTER — Ambulatory Visit (INDEPENDENT_AMBULATORY_CARE_PROVIDER_SITE_OTHER): Payer: Medicaid Other | Admitting: Family Medicine

## 2023-09-02 ENCOUNTER — Encounter: Payer: Self-pay | Admitting: Family Medicine

## 2023-09-02 VITALS — BP 115/71 | HR 65 | Wt 201.0 lb

## 2023-09-02 DIAGNOSIS — M542 Cervicalgia: Secondary | ICD-10-CM | POA: Insufficient documentation

## 2023-09-02 MED ORDER — DICLOFENAC SODIUM 1 % EX GEL
CUTANEOUS | 1 refills | Status: AC
  Filled 2023-09-02: qty 100, 20d supply, fill #0

## 2023-09-02 NOTE — Assessment & Plan Note (Signed)
Seems to be a superficial discomfort as opposed to musculoskeletal issue or nerve root impingement.  Trial of diclofenac gel.  Recommend not getting xray due to radiation unless worsens

## 2023-09-02 NOTE — Patient Instructions (Signed)
Good to see you today - Thank you for coming in  Things we discussed today:  Neck Discomfort Use the diclofenac gel four times a day as needed If not better in 2 weeks or if weakness or severe pain then come back

## 2023-09-02 NOTE — Progress Notes (Signed)
    SUBJECTIVE:   CHIEF COMPLAINT / HPI:   Neck Discomfort For weeks has had a feeling of nerve sensitvity in back of neck.  No injury or weakness or loss of sensation in hands or feet.  Neck massager seems to irritate specific areas sometime. Tried prior cream he got for a neck rash which did not help.  Not taking any medications regularly He wonders if he needs an xray.    PERTINENT  PMH / PSH: Migraine, TBI, GERD, asthma   OBJECTIVE:   BP 115/71   Pulse 65   Wt 201 lb (91.2 kg)   SpO2 100%   BMI 28.03 kg/m   No distress Neck:  No deformities, thyromegaly, masses, noted.   Supple with full range of motion without pain.  Does feel abnormal when palpated at base of occiput bilaterally Skin over neck and shoulder normal Strength normal in upper and lower extremities    ASSESSMENT/PLAN:   Neck pain Assessment & Plan: Seems to be a superficial discomfort as opposed to musculoskeletal issue or nerve root impingement.  Trial of diclofenac gel.  Recommend not getting xray due to radiation unless worsens   Orders: -     Diclofenac Sodium; Apply up to four times a day as needed.  Dispense: 100 g; Refill: 1     Patient Instructions  Good to see you today - Thank you for coming in  Things we discussed today:  Neck Discomfort Use the diclofenac gel four times a day as needed If not better in 2 weeks or if weakness or severe pain then come back   Carney Living, MD [facility]

## 2023-09-04 DIAGNOSIS — F4010 Social phobia, unspecified: Secondary | ICD-10-CM | POA: Diagnosis not present

## 2023-09-11 DIAGNOSIS — F4010 Social phobia, unspecified: Secondary | ICD-10-CM | POA: Diagnosis not present

## 2023-09-14 ENCOUNTER — Other Ambulatory Visit (HOSPITAL_COMMUNITY): Payer: Self-pay

## 2023-09-15 DIAGNOSIS — F4010 Social phobia, unspecified: Secondary | ICD-10-CM | POA: Diagnosis not present

## 2023-09-22 DIAGNOSIS — F4010 Social phobia, unspecified: Secondary | ICD-10-CM | POA: Diagnosis not present

## 2023-09-25 DIAGNOSIS — F4010 Social phobia, unspecified: Secondary | ICD-10-CM | POA: Diagnosis not present

## 2023-10-02 ENCOUNTER — Ambulatory Visit: Payer: Medicaid Other | Admitting: Family Medicine

## 2023-10-02 NOTE — Progress Notes (Deleted)
    SUBJECTIVE:   CHIEF COMPLAINT / HPI:   Here to discuss memory concerns ***  PERTINENT  PMH / PSH: ***  OBJECTIVE:   There were no vitals taken for this visit.  ***  ASSESSMENT/PLAN:   Assessment & Plan    Vonna Drafts, MD [facility]

## 2023-10-06 DIAGNOSIS — F4010 Social phobia, unspecified: Secondary | ICD-10-CM | POA: Diagnosis not present

## 2023-10-13 DIAGNOSIS — F4010 Social phobia, unspecified: Secondary | ICD-10-CM | POA: Diagnosis not present

## 2023-10-26 ENCOUNTER — Other Ambulatory Visit (HOSPITAL_BASED_OUTPATIENT_CLINIC_OR_DEPARTMENT_OTHER): Payer: Self-pay

## 2023-10-26 ENCOUNTER — Other Ambulatory Visit (HOSPITAL_COMMUNITY): Payer: Self-pay

## 2023-10-27 ENCOUNTER — Other Ambulatory Visit (HOSPITAL_COMMUNITY): Payer: Self-pay

## 2023-11-24 ENCOUNTER — Other Ambulatory Visit: Payer: Self-pay

## 2023-12-19 ENCOUNTER — Other Ambulatory Visit: Payer: Self-pay

## 2023-12-19 ENCOUNTER — Other Ambulatory Visit (HOSPITAL_COMMUNITY): Payer: Self-pay

## 2023-12-21 ENCOUNTER — Other Ambulatory Visit (HOSPITAL_COMMUNITY): Payer: Self-pay

## 2023-12-22 ENCOUNTER — Other Ambulatory Visit: Payer: Self-pay

## 2023-12-22 ENCOUNTER — Other Ambulatory Visit (HOSPITAL_COMMUNITY): Payer: Self-pay

## 2023-12-28 ENCOUNTER — Other Ambulatory Visit (HOSPITAL_COMMUNITY): Payer: Self-pay

## 2023-12-29 ENCOUNTER — Other Ambulatory Visit: Payer: Self-pay

## 2023-12-29 DIAGNOSIS — F419 Anxiety disorder, unspecified: Secondary | ICD-10-CM

## 2023-12-30 ENCOUNTER — Other Ambulatory Visit (HOSPITAL_COMMUNITY): Payer: Self-pay

## 2023-12-30 MED ORDER — ESCITALOPRAM OXALATE 5 MG PO TABS
5.0000 mg | ORAL_TABLET | Freq: Every day | ORAL | 3 refills | Status: DC
  Filled 2023-12-30 – 2024-01-04 (×5): qty 30, 30d supply, fill #0
  Filled 2024-02-18: qty 30, 30d supply, fill #1
  Filled 2024-04-14: qty 30, 30d supply, fill #2
  Filled 2024-05-18: qty 30, 30d supply, fill #3

## 2023-12-31 ENCOUNTER — Other Ambulatory Visit (HOSPITAL_COMMUNITY): Payer: Self-pay

## 2023-12-31 ENCOUNTER — Other Ambulatory Visit: Payer: Self-pay

## 2024-01-04 ENCOUNTER — Other Ambulatory Visit (HOSPITAL_COMMUNITY): Payer: Self-pay

## 2024-01-20 ENCOUNTER — Other Ambulatory Visit (HOSPITAL_COMMUNITY): Payer: Self-pay

## 2024-02-04 ENCOUNTER — Other Ambulatory Visit (HOSPITAL_COMMUNITY): Payer: Self-pay

## 2024-02-04 ENCOUNTER — Encounter: Payer: Self-pay | Admitting: Family Medicine

## 2024-02-04 ENCOUNTER — Ambulatory Visit (INDEPENDENT_AMBULATORY_CARE_PROVIDER_SITE_OTHER): Payer: Medicaid Other | Admitting: Family Medicine

## 2024-02-04 VITALS — BP 124/78 | HR 66 | Ht 71.0 in | Wt 193.4 lb

## 2024-02-04 DIAGNOSIS — R519 Headache, unspecified: Secondary | ICD-10-CM | POA: Diagnosis not present

## 2024-02-04 DIAGNOSIS — R42 Dizziness and giddiness: Secondary | ICD-10-CM

## 2024-02-04 MED ORDER — FLUTICASONE PROPIONATE 50 MCG/ACT NA SUSP
2.0000 | Freq: Every day | NASAL | 0 refills | Status: AC
  Filled 2024-02-04 – 2024-02-05 (×2): qty 16, 30d supply, fill #0

## 2024-02-04 MED ORDER — LORATADINE 10 MG PO TABS
10.0000 mg | ORAL_TABLET | Freq: Every day | ORAL | 11 refills | Status: AC
  Filled 2024-02-04: qty 30, 30d supply, fill #0

## 2024-02-04 NOTE — Patient Instructions (Signed)
It was great to see you today! Thank you for choosing [facility] for your primary care.  Today we addressed:    We are checking some labs today, including .  You will get a MyChart message or a letter if results are normal. Otherwise, you will get a call from [place].  If you had a referral placed, they will call you to set up an appointment. Please give [place] a call if you don't hear back in the next 2 weeks.  You should return to our clinic .  Thank you for coming to see [place] at [facility] and for the opportunity to care for you! Brogen Duell, MD 02/04/2024, 2:14 PM

## 2024-02-04 NOTE — Progress Notes (Signed)
SUBJECTIVE:   CHIEF COMPLAINT / HPI:  Cody [name] is a 33 y.o. 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  Vertigo/Sinus pressure/L ear fullness Symptoms began around Monday or Tuesday, 3-4 days ago.  Started feeling pressure in his "sinuses." Felt dizziness and had balance issues upon awakening at the time.  Also noticed photophobia. Thought he might be slurring a bit at onset of pressure. Left ear felt like it was "clogged," sounds are muffled. Feels pressure (no pain) bilaterally near occiput and over frontal sinus region. Took unknown OTC "sinus pills" yesterday morning and felt better. Photophobia has resolved, still feels the sinus and occiput pressure sensation. Has had known lazy eye since he was a young child. Has a history of complex migraines, followed up with Neuro. Has been having some tightness in his ear for several days beforehand. Denies fevers, chills rhinorrhea, otorrhea, frank headache, weakness. Has ringing in his ears every few days, nearly constantly. Has not taken migraine medicines, fluticasone, or loratadine in a while.   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 Has memory issues, likely from TBI, on disability. Lives alone.   OBJECTIVE:   BP 124/78   Pulse 66   Ht 5\' 11"  (1.803 m)   Wt 193 lb 6.4 oz (87.7 kg)   SpO2 97%   BMI 26.97 kg/m   General: Age-appropriate, resting comfortably in chair, NAD, alert and at baseline. HEENT:  Head: Normocephalic, atraumatic. Pressure but no tenderness to percussion over sinuses. Eyes: PERRLA. No conjunctival erythema or scleral injections. L eye amblyopia. Ears: Small L middle ear effusion. TMs non-bulging and non-erythematous bilaterally. No erythema of external ear canal. No cerumen impaction. Nose: Erythematous turbinates. Crusted  rhinorrhea. Mouth/Oral: Clear, no tonsillar exudate. Some cobblestoning.  MMM. Neck: Supple. No LAD. Cardiovascular: Regular rate and rhythm. Normal S1/S2. No murmurs, rubs, or gallops appreciated. 2+ radial pulses. Skin: Warm and dry. Extremities: No peripheral edema bilaterally. Capillary refill <2 seconds. Special: Dix-Hallpike maneuver negative.  Neurological Examination: MS:  Awake, alert, interactive. Normal eye contact, answered the questions appropriately, speech was fluent, normal comprehension.  Attention and concentration were normal.  Alert to self, place, month, and situation. Cranial nerves:    CN II:  PERRLA.  Visual fields intact to confrontation.  Blinks normally to threat bilaterally. CNs III, IV, VI:  Full extraocular eye movement without nystagmus.  No ptosis or diplopia. L eye amblyopia. CN V:  Facial sensation is normal, no weakness of masticatory muscles.  CN VII:  No facial weakness or asymmetry.  CN VIII:  Auditory acuity grossly normal.  Hearing intact to finger rub bilaterally. CNs XI/X:  Palate elevation symmetric. CN XI:  Normal sternocleidomastoid and trapezius strength. CN XII: Tongue is midline without atrophy or fasciculations. MOTOR:  Strength 5/5 RUE, 5/5 LUE, 5/5 RLE, 5/5 LLE. COORDINATION:  Intact finger-to-nose, no tremor.  No difficulty with balance. SENSATION:  Intact to light touch all four extremities.  Romberg negative. GAIT:  Normal walk.    ASSESSMENT/PLAN:   Assessment & Plan Vertigo Favor eustachian tube dysfunction secondary allergies and congestion given sinus symptoms, erythematous turbinates, cobblestoning, and left middle ear effusion.  Patient's sensation of fullness in ears in context of ear ringing and some vertigo may indicate Mnire's disease.  Dix-Hallpike negative.  Complex migraine also possible given history and noncompliance of medications.  However,  given history of TBI and possible dysarthria with transient vertigo  episode, TIA/small stroke cannot be ruled out, though would be unusual at age of 64.  Reassuringly, neuro exam unremarkable and outside intervention time window if patient did have stroke.  After shared decision-making patient prefers to undergo MRI. -Refilled fluticasone BID and loratadine 10 mg daily and instructed to start taking -Routine MRI -Emergency precautions discussed if further unresolving vertigo, dysarthria, balance, unilateral weakness  Cody Lejeune Sharion Dove, MD [facility]

## 2024-02-05 ENCOUNTER — Other Ambulatory Visit (HOSPITAL_COMMUNITY): Payer: Self-pay

## 2024-02-05 ENCOUNTER — Other Ambulatory Visit: Payer: Self-pay

## 2024-02-11 ENCOUNTER — Ambulatory Visit (HOSPITAL_COMMUNITY)
Admission: RE | Admit: 2024-02-11 | Discharge: 2024-02-11 | Disposition: A | Discharge status: Home / Self Care | Payer: Medicaid Other | Source: Ambulatory Visit | Attending: Family Medicine | Admitting: Family Medicine

## 2024-02-11 DIAGNOSIS — R42 Dizziness and giddiness: Secondary | ICD-10-CM | POA: Insufficient documentation

## 2024-02-11 DIAGNOSIS — R519 Headache, unspecified: Secondary | ICD-10-CM | POA: Insufficient documentation

## 2024-02-11 MED ORDER — GADOBUTROL 1 MMOL/ML IV SOLN
8.0000 mL | Freq: Once | INTRAVENOUS | Status: AC | PRN
  Administered 2024-02-11: 8 mL via INTRAVENOUS

## 2024-02-15 ENCOUNTER — Encounter: Payer: Self-pay | Admitting: Family Medicine

## 2024-02-18 ENCOUNTER — Other Ambulatory Visit (HOSPITAL_COMMUNITY): Payer: Self-pay

## 2024-02-19 ENCOUNTER — Other Ambulatory Visit: Payer: Self-pay

## 2024-02-19 DIAGNOSIS — F331 Major depressive disorder, recurrent, moderate: Secondary | ICD-10-CM | POA: Diagnosis not present

## 2024-02-20 ENCOUNTER — Other Ambulatory Visit (HOSPITAL_COMMUNITY): Payer: Self-pay

## 2024-02-22 ENCOUNTER — Other Ambulatory Visit (HOSPITAL_COMMUNITY): Payer: Self-pay

## 2024-02-22 DIAGNOSIS — F331 Major depressive disorder, recurrent, moderate: Secondary | ICD-10-CM | POA: Diagnosis not present

## 2024-02-24 ENCOUNTER — Other Ambulatory Visit (HOSPITAL_COMMUNITY): Payer: Self-pay

## 2024-02-29 DIAGNOSIS — F331 Major depressive disorder, recurrent, moderate: Secondary | ICD-10-CM | POA: Diagnosis not present

## 2024-03-02 ENCOUNTER — Telehealth: Payer: Self-pay

## 2024-03-02 NOTE — Telephone Encounter (Signed)
 Patient calls nurse line requesting an apt.   He reports he has a hx of asthma and used to be prescribed an inhaler. He reports he has not had to use his inhaler in years.   He reports over the last few weeks he has felt short of breath with exertion. He denies any cold symptoms. No cough or congestion. Unsure if he has allergies.   He was speaking in full sentences during our phone call.   Patient scheduled for Friday in ATC for evaluation.   ED precautions discussed.

## 2024-03-04 ENCOUNTER — Ambulatory Visit

## 2024-03-04 ENCOUNTER — Other Ambulatory Visit (HOSPITAL_COMMUNITY): Payer: Self-pay

## 2024-03-04 VITALS — BP 108/71 | HR 67 | Ht 71.0 in | Wt 197.2 lb

## 2024-03-04 DIAGNOSIS — J4520 Mild intermittent asthma, uncomplicated: Secondary | ICD-10-CM

## 2024-03-04 MED ORDER — ALBUTEROL SULFATE HFA 108 (90 BASE) MCG/ACT IN AERS
2.0000 | INHALATION_SPRAY | RESPIRATORY_TRACT | 0 refills | Status: DC | PRN
  Filled 2024-03-04: qty 18, 17d supply, fill #0
  Filled 2024-03-18: qty 18, 25d supply, fill #0

## 2024-03-04 NOTE — Assessment & Plan Note (Signed)
 Suspected mild exacerbation, improving with albuterol. Possibly GERD playing into this as well. Unlikely cardiac given age and history - Prescribed albuterol inhaler - Scheduled pulmonary function tests in April with Dr. Raymondo Band - ED/return precautions

## 2024-03-04 NOTE — Progress Notes (Signed)
    SUBJECTIVE:   CHIEF COMPLAINT / HPI: Shortness of breath  Telephone call: Patient calls nurse line requesting an apt.  He reports he has a hx of asthma and used to be prescribed an inhaler. He reports he has not had to use his inhaler in years.  He reports over the last few weeks he has felt short of breath with exertion. He denies any cold symptoms. No cough or congestion. Unsure if he has allergies.  He was speaking in full sentences during our phone call.  Patient scheduled for Friday in ATC for evaluation.  ED precautions discussed.  Presents with shortness of breath and chest discomfort for the past two days. The symptoms are described as a burning sensation in the chest, particularly when lifting objects, and a feeling of having inhaled something. Denies any aspiration events. The discomfort is localized to the left side of the chest. The patient also reports a sensation of needing to constantly clear his throat upon waking up in the morning.  The patient denies experiencing these symptoms during long walks or other forms of exertion. He has been using a borrowed albuterol inhaler, which has provided great relief. The patient also reports a single episode of coughing and some "wheezing", but denies any fever or vomiting.  PERTINENT  PMH / PSH: mild intermittent asthma   OBJECTIVE:   BP 108/71   Pulse 67   Ht 5\' 11"  (1.803 m)   Wt 197 lb 3.2 oz (89.4 kg)   SpO2 98%   BMI 27.50 kg/m   General: Well appearing, NAD, awake, alert, responsive to questions Head: Normocephalic atraumatic CV: Regular rate and rhythm no murmurs rubs or gallops Respiratory: Clear to ausculation bilaterally, no wheezes rales or crackles, chest rises symmetrically,  no increased work of breathing, no chest wall tenderness Extremities: Moves upper and lower extremities freely, no edema in LE, no calf tenderness  ASSESSMENT/PLAN:   Assessment & Plan Mild intermittent asthma, unspecified whether  complicated Suspected mild exacerbation, improving with albuterol. Possibly GERD playing into this as well. Unlikely cardiac given age and history - Prescribed albuterol inhaler - Scheduled pulmonary function tests in April with Dr. Raymondo Band - ED/return precautions   Levin Erp, MD [facility]

## 2024-03-04 NOTE — Patient Instructions (Signed)
 It was great to see you! Thank you for allowing me to participate in your care!   Our plans for today:  - I am going to schedule you for an appointment for pulmonary function testing  Take care and seek immediate care sooner if you develop any concerns.  Levin Erp, MD

## 2024-03-07 DIAGNOSIS — F331 Major depressive disorder, recurrent, moderate: Secondary | ICD-10-CM | POA: Diagnosis not present

## 2024-03-14 DIAGNOSIS — F331 Major depressive disorder, recurrent, moderate: Secondary | ICD-10-CM | POA: Diagnosis not present

## 2024-03-16 ENCOUNTER — Other Ambulatory Visit (HOSPITAL_COMMUNITY): Payer: Self-pay

## 2024-03-18 ENCOUNTER — Other Ambulatory Visit: Payer: Self-pay

## 2024-03-18 ENCOUNTER — Other Ambulatory Visit (HOSPITAL_COMMUNITY): Payer: Self-pay

## 2024-03-21 DIAGNOSIS — F331 Major depressive disorder, recurrent, moderate: Secondary | ICD-10-CM | POA: Diagnosis not present

## 2024-03-29 DIAGNOSIS — F331 Major depressive disorder, recurrent, moderate: Secondary | ICD-10-CM | POA: Diagnosis not present

## 2024-04-02 ENCOUNTER — Other Ambulatory Visit (HOSPITAL_COMMUNITY): Payer: Self-pay

## 2024-04-04 ENCOUNTER — Other Ambulatory Visit (HOSPITAL_COMMUNITY): Payer: Self-pay

## 2024-04-04 ENCOUNTER — Other Ambulatory Visit: Payer: Self-pay

## 2024-04-04 DIAGNOSIS — F331 Major depressive disorder, recurrent, moderate: Secondary | ICD-10-CM | POA: Diagnosis not present

## 2024-04-04 MED ORDER — SODIUM FLUORIDE 1.1 % DT CREA
TOPICAL_CREAM | DENTAL | 0 refills | Status: AC
  Filled 2024-04-04: qty 51, 30d supply, fill #0

## 2024-04-11 DIAGNOSIS — F331 Major depressive disorder, recurrent, moderate: Secondary | ICD-10-CM | POA: Diagnosis not present

## 2024-04-14 ENCOUNTER — Other Ambulatory Visit (HOSPITAL_COMMUNITY): Payer: Self-pay

## 2024-04-15 ENCOUNTER — Other Ambulatory Visit (HOSPITAL_COMMUNITY): Payer: Self-pay

## 2024-04-18 DIAGNOSIS — F331 Major depressive disorder, recurrent, moderate: Secondary | ICD-10-CM | POA: Diagnosis not present

## 2024-04-19 ENCOUNTER — Ambulatory Visit: Admitting: Student

## 2024-04-19 NOTE — Progress Notes (Deleted)
  SUBJECTIVE:   CHIEF COMPLAINT / HPI:   Urinary frequency:  PERTINENT  PMH / PSH: GERD, anxiety, migraines, mild intermittent asthma  OBJECTIVE:  There were no vitals taken for this visit. ***  ASSESSMENT/PLAN:   Assessment & Plan  No follow-ups on file. Veronia Goon, DO 04/19/2024, 8:06 AM PGY-3, [facility] {    This will disappear when note is signed, click to select method of visit    :1}

## 2024-04-20 ENCOUNTER — Ambulatory Visit: Admitting: Pharmacist

## 2024-04-25 DIAGNOSIS — F331 Major depressive disorder, recurrent, moderate: Secondary | ICD-10-CM | POA: Diagnosis not present

## 2024-05-02 DIAGNOSIS — F331 Major depressive disorder, recurrent, moderate: Secondary | ICD-10-CM | POA: Diagnosis not present

## 2024-05-09 DIAGNOSIS — F331 Major depressive disorder, recurrent, moderate: Secondary | ICD-10-CM | POA: Diagnosis not present

## 2024-05-17 DIAGNOSIS — F331 Major depressive disorder, recurrent, moderate: Secondary | ICD-10-CM | POA: Diagnosis not present

## 2024-05-19 ENCOUNTER — Other Ambulatory Visit (HOSPITAL_COMMUNITY): Payer: Self-pay

## 2024-05-27 DIAGNOSIS — F331 Major depressive disorder, recurrent, moderate: Secondary | ICD-10-CM | POA: Diagnosis not present

## 2024-06-02 ENCOUNTER — Other Ambulatory Visit: Payer: Self-pay

## 2024-06-02 ENCOUNTER — Other Ambulatory Visit (HOSPITAL_COMMUNITY): Payer: Self-pay

## 2024-06-02 MED ORDER — CLINDAMYCIN HCL 300 MG PO CAPS
300.0000 mg | ORAL_CAPSULE | Freq: Four times a day (QID) | ORAL | 0 refills | Status: AC
  Filled 2024-06-02 – 2024-06-20 (×2): qty 28, 7d supply, fill #0

## 2024-06-02 MED ORDER — IBUPROFEN 800 MG PO TABS
800.0000 mg | ORAL_TABLET | Freq: Three times a day (TID) | ORAL | 0 refills | Status: AC | PRN
  Filled 2024-06-02: qty 15, 5d supply, fill #0

## 2024-06-06 ENCOUNTER — Encounter: Payer: Self-pay | Admitting: Family Medicine

## 2024-06-06 ENCOUNTER — Ambulatory Visit (INDEPENDENT_AMBULATORY_CARE_PROVIDER_SITE_OTHER): Admitting: Family Medicine

## 2024-06-06 VITALS — BP 108/64 | HR 63 | Ht 71.0 in | Wt 191.4 lb

## 2024-06-06 DIAGNOSIS — K219 Gastro-esophageal reflux disease without esophagitis: Secondary | ICD-10-CM

## 2024-06-06 DIAGNOSIS — L989 Disorder of the skin and subcutaneous tissue, unspecified: Secondary | ICD-10-CM | POA: Diagnosis not present

## 2024-06-06 NOTE — Assessment & Plan Note (Signed)
 H pylori testing today per pt preference and continued reflux symptoms with h2 blocker Has been PPI free for >2wks

## 2024-06-06 NOTE — Progress Notes (Signed)
    SUBJECTIVE:   CHIEF COMPLAINT / HPI:    Epigastric pain/reflux symptoms after eating Desires H pylori testing - states his mom had it Used to take omeprazole  but has not taken this recently  Currently taking famotidine  - minimal relief with this   Skin changes on tip of penis Present for several years  Asymptomatic - no pain, itching, drainage/bleeding Asking if this is removable for cosmetic reasons Has tested negative for STDs and not currently sexually active - has not been for several years    PERTINENT  PMH / PSH: History of migraines, asthma, GERD  OBJECTIVE:   BP 108/64   Pulse 63   Ht 5' 11 (1.803 m)   Wt 191 lb 6.4 oz (86.8 kg)   SpO2 100%   BMI 26.69 kg/m   General: NAD, pleasant, able to participate in exam Respiratory: No respiratory distress Skin: On the glans of the penis, several flat macular hypopigmented spots are present.  No surrounding erythema.  No evidence of blistering, drainage, bleeding, excoriation. Psych: Normal affect and mood  ASSESSMENT/PLAN:   Assessment & Plan Gastroesophageal reflux disease without esophagitis H pylori testing today per pt preference and continued reflux symptoms with h2 blocker Has been PPI free for >2wks Skin abnormality Several macular hypopigmented spots on glans of penis, reportedly present since undergoing circumcision as an adult several years ago Asymptomatic Low suspicion for any concerning pathology, provided reassurance Unaware of any dermatologic interventions that could improve this especially without symptoms - discussed Low suspicion for any STI given chronicity, negative testing in the past, and no recent sexual activity Discussed return precautions   Edison Gore, MD [facility]

## 2024-06-06 NOTE — Patient Instructions (Signed)
 I believe you have postinflammatory hypopigmentation.  I would not do anything about this currently unless you start to have any symptoms including itching, burning, pain, or bleeding  I will let you know if your H. pylori testing is positive.  If everything looks normal, I will send you a message on MyChart

## 2024-06-07 DIAGNOSIS — F331 Major depressive disorder, recurrent, moderate: Secondary | ICD-10-CM | POA: Diagnosis not present

## 2024-06-08 ENCOUNTER — Ambulatory Visit: Payer: Self-pay | Admitting: Family Medicine

## 2024-06-08 LAB — H. PYLORI BREATH TEST: H pylori Breath Test: NEGATIVE

## 2024-06-16 ENCOUNTER — Other Ambulatory Visit: Payer: Self-pay | Admitting: Family Medicine

## 2024-06-16 ENCOUNTER — Other Ambulatory Visit (HOSPITAL_COMMUNITY): Payer: Self-pay

## 2024-06-16 MED ORDER — OMEPRAZOLE 20 MG PO CPDR
20.0000 mg | DELAYED_RELEASE_CAPSULE | Freq: Every day | ORAL | 3 refills | Status: DC
  Filled 2024-06-16: qty 30, 30d supply, fill #0
  Filled 2024-08-17: qty 30, 30d supply, fill #1

## 2024-06-17 ENCOUNTER — Other Ambulatory Visit (HOSPITAL_COMMUNITY): Payer: Self-pay

## 2024-06-20 ENCOUNTER — Other Ambulatory Visit (HOSPITAL_COMMUNITY): Payer: Self-pay

## 2024-06-20 ENCOUNTER — Encounter: Payer: Self-pay | Admitting: Pharmacist

## 2024-06-20 ENCOUNTER — Other Ambulatory Visit: Payer: Self-pay

## 2024-06-29 DIAGNOSIS — F331 Major depressive disorder, recurrent, moderate: Secondary | ICD-10-CM | POA: Diagnosis not present

## 2024-06-30 ENCOUNTER — Ambulatory Visit

## 2024-06-30 VITALS — BP 121/66 | HR 57 | Ht 71.0 in | Wt 195.0 lb

## 2024-06-30 DIAGNOSIS — T148XXA Other injury of unspecified body region, initial encounter: Secondary | ICD-10-CM | POA: Diagnosis present

## 2024-06-30 NOTE — Patient Instructions (Addendum)
 It was great to see you! Thank you for allowing me to participate in your care!  I recommend that you always bring your medications to each appointment as this makes it easy to ensure we are on the correct medications and helps us  not miss when refills are needed.  Our plans for today:  - Arm Cramps It sounding like you may have strained a muscle in your back or arm. This should get better with time. Use rest, heating pads, and ice as needed to help with symptoms.    Pain Regimen Continue to use Voltaren  Gel on your back/arm throughout the day   Try lidocaine  patches to arm, if pain not well controlled.    Take tylenol  as needed for pain     1,000 mg every 8 hours as needed      Or    500 mg every 4 hours as needed    *No more than 3,000 mg of tylenol  in a day      Follow up If symptoms persist, follow up with [facility]  [facility] Address: [address] [place], [place], [place] [address] Phone: [phone]  *If you develop weakness, numbness, or changes in sensation in arms or legs, go to emergency department right away.   Take care and seek immediate care sooner if you develop any concerns.   Dr. Penne Rhein, MD [facility]

## 2024-06-30 NOTE — Assessment & Plan Note (Addendum)
 Patient comes in with complaint of muscle strain/pain in right arm/bicep area.  Patient appreciated 2 to 3 days of arm pain, and neck pain following episode of throwing a heavy bag into a trash.  Patient appreciates neck pain has gotten better with Tylenol , the arm pain was only relieved when he placed Voltaren  gel on his back.  Patient appreciates no arm pain today.  Patient appreciates no weakness, or balance issues, or changes in bowel/bladder habits, is in normal state of health.  Patient has normal neuroexam, with good strength in all extremities.  Suspect patient may have strained muscle in the back versus arm that was causing discomfort.  Will recommend patient continue Voltaren  gel, and try lidocaine  patches as needed.  Will recommend patient follow-up with sports medicine if symptoms not improving. - Voltaren  gel as needed - Lidocaine  patches as needed - Follow-up sports medicine approved

## 2024-06-30 NOTE — Progress Notes (Signed)
  SUBJECTIVE:   CHIEF COMPLAINT / HPI:   Rt Arm Cramps Starting 3-4 days ago was throwing a heavy bag into the trash when he felt pain afterwards in his shoulders, and charlie horse sensation in his right arm. Tylenol  helped with the pain in the back/neck, but arm pain only got better when he put on some Voltaren  gel. Arm no longer hurting him today, has noticed no issues with strength or balance, or weakness in upper/lower extremities. Is normal state of health. Using the bathroom like normal no bladder/bowel incontinence.  PERTINENT  PMH / PSH:   OBJECTIVE:  BP 121/66   Pulse (!) 57   Ht 5' 11 (1.803 m)   Wt 195 lb (88.5 kg)   SpO2 99%   BMI 27.20 kg/m  Physical Exam Constitutional:      Appearance: Normal appearance.  Pulmonary:     Effort: Pulmonary effort is normal.  Neurological:     Mental Status: He is alert.     Cranial Nerves: Cranial nerves 2-12 are intact. No cranial nerve deficit, dysarthria or facial asymmetry.     Sensory: Sensation is intact. No sensory deficit.     Motor: Motor function is intact. No weakness.     Comments: 5/5 strength in UE and LE at all joints      ASSESSMENT/PLAN:   Assessment & Plan Muscle strain Patient comes in with complaint of muscle strain/pain in right arm/bicep area.  Patient appreciated 2 to 3 days of arm pain, and neck pain following episode of throwing a heavy bag into a trash.  Patient appreciates neck pain has gotten better with Tylenol , the arm pain was only relieved when he placed Voltaren  gel on his back.  Patient appreciates no arm pain today.  Patient appreciates no weakness, or balance issues, or changes in bowel/bladder habits, is in normal state of health.  Patient has normal neuroexam, with good strength in all extremities.  Suspect patient may have strained muscle in the back versus arm that was causing discomfort.  Will recommend patient continue Voltaren  gel, and try lidocaine  patches as needed.  Will recommend  patient follow-up with sports medicine if symptoms not improving. - Voltaren  gel as needed - Lidocaine  patches as needed - Follow-up sports medicine approved No follow-ups on file. Penne Rhein, MD 06/30/2024, 1:31 PM PGY-3, [facility]

## 2024-07-04 DIAGNOSIS — F411 Generalized anxiety disorder: Secondary | ICD-10-CM | POA: Diagnosis not present

## 2024-07-07 ENCOUNTER — Other Ambulatory Visit: Payer: Self-pay | Admitting: Family Medicine

## 2024-07-07 ENCOUNTER — Other Ambulatory Visit: Payer: Self-pay

## 2024-07-07 ENCOUNTER — Other Ambulatory Visit (HOSPITAL_COMMUNITY): Payer: Self-pay

## 2024-07-07 DIAGNOSIS — F419 Anxiety disorder, unspecified: Secondary | ICD-10-CM

## 2024-07-07 MED ORDER — ESCITALOPRAM OXALATE 5 MG PO TABS
5.0000 mg | ORAL_TABLET | Freq: Every day | ORAL | 3 refills | Status: DC
  Filled 2024-07-07: qty 30, 30d supply, fill #0
  Filled 2024-08-22: qty 30, 30d supply, fill #1
  Filled 2024-10-19: qty 30, 30d supply, fill #2
  Filled 2024-12-06: qty 30, 30d supply, fill #3

## 2024-07-08 ENCOUNTER — Other Ambulatory Visit (HOSPITAL_COMMUNITY): Payer: Self-pay

## 2024-07-28 DIAGNOSIS — F331 Major depressive disorder, recurrent, moderate: Secondary | ICD-10-CM | POA: Diagnosis not present

## 2024-08-01 DIAGNOSIS — F411 Generalized anxiety disorder: Secondary | ICD-10-CM | POA: Diagnosis not present

## 2024-08-09 ENCOUNTER — Ambulatory Visit (INDEPENDENT_AMBULATORY_CARE_PROVIDER_SITE_OTHER): Admitting: Student

## 2024-08-09 VITALS — BP 100/68 | HR 59 | Ht 71.0 in | Wt 196.0 lb

## 2024-08-09 DIAGNOSIS — R739 Hyperglycemia, unspecified: Secondary | ICD-10-CM

## 2024-08-09 DIAGNOSIS — R110 Nausea: Secondary | ICD-10-CM | POA: Diagnosis not present

## 2024-08-09 LAB — POCT GLYCOSYLATED HEMOGLOBIN (HGB A1C): Hemoglobin A1C: 5.4 % (ref 4.0–5.6)

## 2024-08-09 NOTE — Patient Instructions (Signed)
 Pleasure to meet you today.  Suspect your nausea is most likely possible viral gastroenteritis.  Your A1c today was 5.4% which is really good and shows no signs of diabetes.  Please make sure you are staying well-hydrated during this hot season.

## 2024-08-09 NOTE — Progress Notes (Signed)
    SUBJECTIVE:   CHIEF COMPLAINT / HPI:   Cody [name] is a 33 year old male who presents with nausea and lightheadedness after consuming sugary foods.  Nausea and lightheadedness occur after consuming sugary foods, starting one week ago and lasting three to four days. He experiences a sensation of tightness in his head during these episodes but no vomiting. Symptoms have resolved, but there is concern about recurrence.  He has gastrointestinal issues, including constipation managed with Linzess . During the symptoms, he experienced severe stomach pain that resolved before the onset of nausea. Diarrhea occurred, but its cause is uncertain. No recent blood in stool.  He has acid reflux exacerbated by soda and acidic foods, consuming soda infrequently, about once a day. Nasal congestion occurred concurrently with other symptoms. No significant increase in urination or thirst beyond what is expected due to hot weather.  PERTINENT  PMH / PSH: Reviewed  OBJECTIVE:   BP 100/68   Pulse (!) 59   Ht 5' 11 (1.803 m)   Wt 196 lb (88.9 kg)   SpO2 100%   BMI 27.34 kg/m    Physical Exam General: Alert, well appearing, NAD Cardiovascular: RRR, No Murmurs, Normal S2/S2 Respiratory: CTAB, No wheezing or Rales Abdomen: No distension or tenderness Extremities: No edema on extremities   Skin: Warm and dry  ASSESSMENT/PLAN:   Nausea Symptoms resolved after 3-4 days. Possible transient reflux or gastritis. Differential includes diabetes, gastroenteritis, or viral infection. - Order A1c to rule out diabetes. -Recommend adequate hydration  Norleen April, MD [facility]

## 2024-08-12 ENCOUNTER — Ambulatory Visit: Admitting: Family Medicine

## 2024-08-15 DIAGNOSIS — F411 Generalized anxiety disorder: Secondary | ICD-10-CM | POA: Diagnosis not present

## 2024-08-16 DIAGNOSIS — F331 Major depressive disorder, recurrent, moderate: Secondary | ICD-10-CM | POA: Diagnosis not present

## 2024-08-17 ENCOUNTER — Other Ambulatory Visit (HOSPITAL_COMMUNITY): Payer: Self-pay

## 2024-08-18 ENCOUNTER — Other Ambulatory Visit (HOSPITAL_COMMUNITY): Payer: Self-pay

## 2024-08-19 ENCOUNTER — Other Ambulatory Visit (HOSPITAL_COMMUNITY): Payer: Self-pay

## 2024-08-19 ENCOUNTER — Encounter (HOSPITAL_COMMUNITY): Payer: Self-pay

## 2024-08-19 DIAGNOSIS — F411 Generalized anxiety disorder: Secondary | ICD-10-CM | POA: Diagnosis not present

## 2024-08-22 ENCOUNTER — Other Ambulatory Visit (HOSPITAL_COMMUNITY): Payer: Self-pay

## 2024-08-23 DIAGNOSIS — F331 Major depressive disorder, recurrent, moderate: Secondary | ICD-10-CM | POA: Diagnosis not present

## 2024-08-29 DIAGNOSIS — F411 Generalized anxiety disorder: Secondary | ICD-10-CM | POA: Diagnosis not present

## 2024-09-02 ENCOUNTER — Ambulatory Visit

## 2024-09-02 ENCOUNTER — Other Ambulatory Visit: Payer: Self-pay

## 2024-09-02 ENCOUNTER — Other Ambulatory Visit (HOSPITAL_COMMUNITY): Payer: Self-pay

## 2024-09-02 VITALS — BP 121/71 | HR 65 | Ht 71.0 in | Wt 196.8 lb

## 2024-09-02 DIAGNOSIS — M542 Cervicalgia: Secondary | ICD-10-CM

## 2024-09-02 DIAGNOSIS — M25472 Effusion, left ankle: Secondary | ICD-10-CM

## 2024-09-02 DIAGNOSIS — K219 Gastro-esophageal reflux disease without esophagitis: Secondary | ICD-10-CM | POA: Diagnosis not present

## 2024-09-02 MED ORDER — FAMOTIDINE 20 MG PO TABS
20.0000 mg | ORAL_TABLET | Freq: Two times a day (BID) | ORAL | 3 refills | Status: AC
  Filled 2024-09-02 – 2024-09-17 (×2): qty 90, 45d supply, fill #0
  Filled 2024-11-29: qty 90, 45d supply, fill #1

## 2024-09-02 NOTE — Progress Notes (Signed)
    SUBJECTIVE:   CHIEF COMPLAINT / HPI:   IA is a 33yo M that pf L ankle swelling. - For past few days, his L ankle feels swollen. Not painful. When he pulls a sock on, it feels tighter.  - Denies any preceding trauma - Denies SOB, recent travel - He has a family hx of bloot clots, so his mom recommendec he come to be seen  - Also reports some neck pain after waking up and turning his too fast. He has been using tyulenol and heating pad which helped.   - Wants to switch omeprazxole to famotidine . He has been on PPI for many years and he wants to avoid using it too long. PERTINENT  PMH / PSH: GERD  OBJECTIVE:   BP 121/71   Pulse 65   Ht 5' 11 (1.803 m)   Wt 196 lb 12.8 oz (89.3 kg)   SpO2 100%   BMI 27.45 kg/m   General: Alert, pleasant woman. NAD. HEENT: NCAT. MMM. CV: RRR, no murmurs. Resp: CTAB, no wheezing or crackles. Normal WOB on RA.  Skin: Warm, well perfused  MSK: Neck full active ROM. TTP along paraspinal cervical area.  Full ROM of L ankle, no swelling or erythema. BL LE appear symmetric.   ASSESSMENT/PLAN:   Assessment & Plan Left ankle swelling Exam reassuring, no tenderness or obvious swelling. Low cf DVT. Discussed return precautions. Recommended supportive care such as elevation at sleep. Neck pain Most cw muscle strain. No red flag symtpoms such as radiating pain or neck rigidity.  - Discussed supportive care - tylenol  and ibuprofen  prn for pain Gastroesophageal reflux disease, unspecified whether esophagitis present - switch omeprazole  to famotidine  20mg  BID     Twyla Nearing, MD [facility]

## 2024-09-02 NOTE — Patient Instructions (Signed)
 Good to see you today - Thank you for coming in  Things we discussed today:  1) Your leg does not look like you have any clots. This is good. You most likely irritated the ankle and caused some swelling. - Keep your leg elevated while you sleep for the next week. This can help improve your swelling  2) You can take tylenol  and ibuprofen  as needed for your neck pain.   3) We will switch your omeprazole  to famotidine . Take this twice a day.   Come back to see us  if your neck pain does not improve in 4 weeks.

## 2024-09-04 NOTE — Assessment & Plan Note (Signed)
 Most cw muscle strain. No red flag symtpoms such as radiating pain or neck rigidity.  - Discussed supportive care - tylenol  and ibuprofen  prn for pain

## 2024-09-05 ENCOUNTER — Other Ambulatory Visit (HOSPITAL_COMMUNITY): Payer: Self-pay

## 2024-09-05 MED ORDER — LINACLOTIDE 72 MCG PO CAPS
72.0000 ug | ORAL_CAPSULE | Freq: Every day | ORAL | 3 refills | Status: AC
  Filled 2024-09-05 – 2024-09-17 (×2): qty 90, 90d supply, fill #0

## 2024-09-13 ENCOUNTER — Other Ambulatory Visit (HOSPITAL_COMMUNITY): Payer: Self-pay

## 2024-09-15 ENCOUNTER — Other Ambulatory Visit (HOSPITAL_COMMUNITY): Payer: Self-pay

## 2024-09-17 ENCOUNTER — Other Ambulatory Visit (HOSPITAL_COMMUNITY): Payer: Self-pay

## 2024-09-22 ENCOUNTER — Other Ambulatory Visit: Payer: Self-pay

## 2024-09-23 DIAGNOSIS — F411 Generalized anxiety disorder: Secondary | ICD-10-CM | POA: Diagnosis not present

## 2024-09-26 ENCOUNTER — Other Ambulatory Visit (HOSPITAL_COMMUNITY): Payer: Self-pay

## 2024-09-26 MED ORDER — CHLORHEXIDINE GLUCONATE 0.12 % MT SOLN
Freq: Two times a day (BID) | OROMUCOSAL | 1 refills | Status: AC
  Filled 2024-09-26: qty 473, 30d supply, fill #0

## 2024-09-27 ENCOUNTER — Other Ambulatory Visit: Payer: Self-pay

## 2024-09-27 ENCOUNTER — Encounter: Payer: Self-pay | Admitting: Pharmacist

## 2024-09-29 ENCOUNTER — Other Ambulatory Visit (HOSPITAL_COMMUNITY): Payer: Self-pay

## 2024-10-07 DIAGNOSIS — F411 Generalized anxiety disorder: Secondary | ICD-10-CM | POA: Diagnosis not present

## 2024-10-17 DIAGNOSIS — F331 Major depressive disorder, recurrent, moderate: Secondary | ICD-10-CM | POA: Diagnosis not present

## 2024-10-20 ENCOUNTER — Other Ambulatory Visit: Payer: Self-pay

## 2024-10-21 ENCOUNTER — Other Ambulatory Visit (HOSPITAL_COMMUNITY): Payer: Self-pay

## 2024-10-21 ENCOUNTER — Encounter: Payer: Self-pay | Admitting: Family Medicine

## 2024-10-21 ENCOUNTER — Ambulatory Visit: Admitting: Family Medicine

## 2024-10-21 VITALS — BP 106/74 | HR 59 | Ht 71.0 in | Wt 197.2 lb

## 2024-10-21 DIAGNOSIS — M542 Cervicalgia: Secondary | ICD-10-CM | POA: Diagnosis present

## 2024-10-21 DIAGNOSIS — F411 Generalized anxiety disorder: Secondary | ICD-10-CM | POA: Diagnosis not present

## 2024-10-21 MED ORDER — TIZANIDINE HCL 4 MG PO TABS
4.0000 mg | ORAL_TABLET | Freq: Three times a day (TID) | ORAL | 0 refills | Status: DC | PRN
  Filled 2024-10-21 – 2024-11-02 (×4): qty 30, 10d supply, fill #0

## 2024-10-21 NOTE — Progress Notes (Signed)
    SUBJECTIVE:   CHIEF COMPLAINT / HPI:   Left-sided neck pain/stiffness This has been an ongoing issue for several months Minimal response to Tylenol  Worse in the mornings, usually stiff when he wakes up Does not have restrictions to his range of motion.  The pain is more so irritating than restricting Denies fevers, numbness or weakness in his arms or legs, recent injury or worsening headaches/vision changes    PERTINENT  PMH / PSH: History of migraines, GERD  OBJECTIVE:   BP 106/74   Pulse (!) 59   Ht 5' 11 (1.803 m)   Wt 197 lb 4 oz (89.5 kg)   SpO2 100%   BMI 27.51 kg/m    General: NAD, pleasant, able to participate in exam MSK: Nontender to palpation of cervical spine.  Some tightness palpated left trapezius.  Full range of motion of neck and left shoulder.  Nontender throughout shoulder. Respiratory: No respiratory distress Skin: warm and dry, no rashes noted Psych: Normal affect and mood  ASSESSMENT/PLAN:     Assessment & Plan Neck pain Full range of motion. No significant injury or trauma. Suspect muscle spasm - Prescribed tizanidine for short-term muscle tightness relief. - Recommended over-the-counter diclofenac  gel for topical use. - Advised continuation of neck massages, heating pads, and stretching exercises. - Discussed potential for formal physical therapy if symptoms persist; he prefers medications first. - provided home exercises   Payton Coward, MD [facility]

## 2024-10-21 NOTE — Patient Instructions (Signed)
 Use tizanidine as needed for neck pain  You can take this every 8 hours for the next few days and then as needed  Continue ibuprofen , tylenol , voltaren  gel, heating pads, stretching  See attached for neck exercises you can do at home

## 2024-10-21 NOTE — Assessment & Plan Note (Addendum)
 Full range of motion. No significant injury or trauma. Suspect muscle spasm - Prescribed tizanidine for short-term muscle tightness relief. - Recommended over-the-counter diclofenac  gel for topical use. - Advised continuation of neck massages, heating pads, and stretching exercises. - Discussed potential for formal physical therapy if symptoms persist; he prefers medications first. - provided home exercises

## 2024-10-28 DIAGNOSIS — F331 Major depressive disorder, recurrent, moderate: Secondary | ICD-10-CM | POA: Diagnosis not present

## 2024-11-01 ENCOUNTER — Other Ambulatory Visit (HOSPITAL_COMMUNITY): Payer: Self-pay

## 2024-11-02 ENCOUNTER — Other Ambulatory Visit (HOSPITAL_COMMUNITY): Payer: Self-pay

## 2024-11-02 ENCOUNTER — Other Ambulatory Visit: Payer: Self-pay

## 2024-11-04 ENCOUNTER — Other Ambulatory Visit: Payer: Self-pay

## 2024-11-04 DIAGNOSIS — F411 Generalized anxiety disorder: Secondary | ICD-10-CM | POA: Diagnosis not present

## 2024-11-12 ENCOUNTER — Other Ambulatory Visit (HOSPITAL_COMMUNITY): Payer: Self-pay

## 2024-11-14 DIAGNOSIS — F411 Generalized anxiety disorder: Secondary | ICD-10-CM | POA: Diagnosis not present

## 2024-11-24 ENCOUNTER — Ambulatory Visit: Admitting: Student

## 2024-11-28 ENCOUNTER — Ambulatory Visit: Admitting: Student

## 2024-11-29 ENCOUNTER — Other Ambulatory Visit: Payer: Self-pay

## 2024-12-01 ENCOUNTER — Ambulatory Visit: Admitting: Family Medicine

## 2024-12-01 NOTE — Progress Notes (Deleted)
    SUBJECTIVE:   CHIEF COMPLAINT / HPI:   Discussed the use of AI scribe software for clinical note transcription with the patient, who gave verbal consent to proceed.  History of Present Illness      PERTINENT  PMH / PSH: ***  OBJECTIVE:   There were no vitals taken for this visit.   Physical Exam   ***  ASSESSMENT/PLAN:   Assessment and Plan Assessment & Plan     ***  Assessment & Plan    Payton Coward, MD [facility]

## 2024-12-07 ENCOUNTER — Other Ambulatory Visit (HOSPITAL_COMMUNITY): Payer: Self-pay

## 2024-12-26 ENCOUNTER — Ambulatory Visit: Payer: Self-pay | Admitting: Family Medicine

## 2024-12-26 ENCOUNTER — Encounter: Payer: Self-pay | Admitting: Family Medicine

## 2024-12-26 ENCOUNTER — Other Ambulatory Visit (HOSPITAL_COMMUNITY): Payer: Self-pay

## 2024-12-26 VITALS — BP 109/72 | HR 61 | Temp 99.3°F | Ht 71.0 in | Wt 195.4 lb

## 2024-12-26 DIAGNOSIS — R3989 Other symptoms and signs involving the genitourinary system: Secondary | ICD-10-CM | POA: Diagnosis not present

## 2024-12-26 DIAGNOSIS — R300 Dysuria: Secondary | ICD-10-CM | POA: Diagnosis present

## 2024-12-26 DIAGNOSIS — M542 Cervicalgia: Secondary | ICD-10-CM

## 2024-12-26 DIAGNOSIS — G8929 Other chronic pain: Secondary | ICD-10-CM | POA: Diagnosis not present

## 2024-12-26 DIAGNOSIS — M545 Low back pain, unspecified: Secondary | ICD-10-CM

## 2024-12-26 LAB — POCT URINALYSIS DIP (MANUAL ENTRY)
Bilirubin, UA: NEGATIVE
Blood, UA: NEGATIVE
Glucose, UA: NEGATIVE mg/dL
Ketones, POC UA: NEGATIVE mg/dL
Leukocytes, UA: NEGATIVE
Nitrite, UA: NEGATIVE
Protein Ur, POC: NEGATIVE mg/dL
Spec Grav, UA: 1.02
Urobilinogen, UA: 1 U/dL
pH, UA: 6.5

## 2024-12-26 MED ORDER — TIZANIDINE HCL 4 MG PO TABS
4.0000 mg | ORAL_TABLET | Freq: Three times a day (TID) | ORAL | 0 refills | Status: AC | PRN
  Filled 2024-12-26 – 2024-12-27 (×2): qty 30, 10d supply, fill #0

## 2024-12-26 MED ORDER — DOXYCYCLINE HYCLATE 100 MG PO TABS
100.0000 mg | ORAL_TABLET | Freq: Two times a day (BID) | ORAL | 0 refills | Status: AC
  Filled 2024-12-26 – 2024-12-29 (×4): qty 14, 7d supply, fill #0

## 2024-12-26 NOTE — Patient Instructions (Signed)
 You should hear from urology within the next few weeks  Take doxycycline  twice daily for 7 days  Use tizanidine  as needed for low back pain

## 2024-12-26 NOTE — Progress Notes (Signed)
" ° ° °  SUBJECTIVE:   CHIEF COMPLAINT / HPI:   Back pain Seen by me 10/31 for neck pain, was prescribed tizanidine  for this  Discussed the use of AI scribe software for clinical note transcription with the patient, who gave verbal consent to proceed.  History of Present Illness Cody [name] is a 34 year old male who presents with urinary symptoms and chronic back pain.  Lower urinary tract symptoms - Intermittent pelvic pressure and burning with urination for the past couple of months - Temporary improvement of symptoms with Azo, but symptoms recur - Occasional difficulty initiating urination, described as irritating - Recent low-grade fever to 3F - No visible hematuria - No sexual activity since 2018 - Prior negative STD testing - occasional pain/pressure after ejaculation  Chronic lower back pain - Persistent lower back pain for years, described as tightness - Pain worsens with lifting even light objects. He lifts with his back, not his knees/thighs - History of prior back injury from a fire extinguisher falling on his back, believed to contribute to current pain - Has not taken prescribed tizanidine  due to concern about interaction with Lexapro  - has not tried much for the pain     PERTINENT  PMH / PSH: reviewed. Has previously been sent to urology for pain with ejaculation  OBJECTIVE:   BP 109/72   Pulse 61   Temp 99.3 F (37.4 C)   Ht 5' 11 (1.803 m)   Wt 195 lb 6.4 oz (88.6 kg)   SpO2 100%   BMI 27.25 kg/m    General: NAD, pleasant, able to participate in exam Respiratory: No respiratory distress Abd: soft, nondistended, TTP suprapubic region, otherwise nontender. No guarding/rebound. No cva tenderness MSK: mild TTP lumbar paraspinal musculature b/l. No midline tenderness. FROM of back. When asked to lift a chair in the room he bends at the back when lifting Skin: warm and dry, no rashes noted Psych: Normal affect and mood  ASSESSMENT/PLAN:    Assessment  & Plan Dysuria Bladder pain UA unremarkable however symptoms seem consistent with UTI / ?prostatitis Trial doxycycline  x7d Placed referral to urology for further eval Chronic bilateral low back pain without sciatica Suspect MSK strain Trial lidocaine  patches Sent referral to PT Discussed proper weight lifting strategies and provided rehab exercises Rx tizanidine  for temporary pain relief   Cody Coward, MD [facility] "

## 2024-12-27 ENCOUNTER — Other Ambulatory Visit (HOSPITAL_COMMUNITY): Payer: Self-pay

## 2024-12-28 ENCOUNTER — Other Ambulatory Visit: Payer: Self-pay

## 2024-12-29 ENCOUNTER — Other Ambulatory Visit: Payer: Self-pay

## 2024-12-29 ENCOUNTER — Other Ambulatory Visit (HOSPITAL_COMMUNITY): Payer: Self-pay

## 2024-12-30 ENCOUNTER — Other Ambulatory Visit: Payer: Self-pay

## 2024-12-31 ENCOUNTER — Other Ambulatory Visit (HOSPITAL_COMMUNITY): Payer: Self-pay

## 2025-01-05 ENCOUNTER — Ambulatory Visit: Attending: Family Medicine | Admitting: Physical Therapy

## 2025-01-05 ENCOUNTER — Other Ambulatory Visit: Payer: Self-pay

## 2025-01-05 ENCOUNTER — Encounter: Payer: Self-pay | Admitting: Physical Therapy

## 2025-01-05 DIAGNOSIS — G8929 Other chronic pain: Secondary | ICD-10-CM | POA: Insufficient documentation

## 2025-01-05 DIAGNOSIS — M545 Low back pain, unspecified: Secondary | ICD-10-CM | POA: Diagnosis not present

## 2025-01-05 DIAGNOSIS — M5459 Other low back pain: Secondary | ICD-10-CM | POA: Insufficient documentation

## 2025-01-05 NOTE — Therapy (Signed)
 " OUTPATIENT PHYSICAL THERAPY THORACOLUMBAR EVALUATION   Patient Name: Cody [name] MRN: [id number] DOB:1991/01/27, 34 y.o., male Today's Date: 01/05/2025  END OF SESSION:  PT End of Session - 01/05/25 1456     Visit Number 1    Number of Visits 10    Date for Recertification  02/16/25    Authorization Type [employer] MCD    Progress Note Due on Visit 10    PT Start Time 1448    PT Stop Time 1525    PT Time Calculation (min) 37 min    Activity Tolerance Patient tolerated treatment well    Behavior During Therapy WFL for tasks assessed/performed          Past Medical History:  Diagnosis Date   Anxiety    Asthma    childhood   GERD (gastroesophageal reflux disease)    Vitamin D  deficiency    Past Surgical History:  Procedure Laterality Date   CIRCUMCISION N/A 03/23/2013   Procedure: Circumcision  ;  Surgeon: Ricardo Likens, MD;  Location: [facility];  Service: Urology;  Laterality: N/A;  with block   Patient Active Problem List   Diagnosis Date Noted   Muscle strain 06/30/2024   Neck pain 09/02/2023   Pain with ejaculation 05/21/2023   Migraine with aura and without status migrainosus, not intractable 12/11/2022   TBI (traumatic brain injury) (HCC) 05/07/2022   Chronic constipation 07/12/2021   Gastroesophageal reflux disease without esophagitis 03/10/2021   Mild intermittent asthma 03/10/2021   Seasonal allergies 03/10/2021   Anxiety 03/10/2021   Pearly penile papules 03/10/2021   Itchy scalp 03/10/2021    PCP: Payton Coward, MD  REFERRING PROVIDER:  Madelon Donald HERO, DO     REFERRING DIAG: [phone] (ICD-10-CM) - Chronic bilateral low back pain without sciatica   Rationale for Evaluation and Treatment: Rehabilitation  THERAPY DIAG:  Other low back pain  ONSET DATE: chronic 8-10 years  SUBJECTIVE:                                                                                                                                                                                            SUBJECTIVE STATEMENT: Pt states that he has been having low back pain  Symptoms began about 10 years ago when he had a small fire extinguisher fall on his low back. Noted pins and needles sensation initially which subsided. Notes that he has been having inc fatigue and difficulty with lifting items. Reports that symptoms have been episodic in nature. Has inc tightness in thoracolumbar region. Symptoms range in intensity from 2/10-6/10. Symptoms inc with lifting, improves with heat or  topical analgesics. Reports neck and bilateral shoulder pain as well, interested in referral and adding to PT POC in the future. Intermittent paraesthesia in BLE, unable to recall last episode of this.   PERTINENT HISTORY:  From referring provider visit 12/26/24:  Chronic lower back pain - Persistent lower back pain for years, described as tightness - Pain worsens with lifting even light objects. He lifts with his back, not his knees/thighs - History of prior back injury from a fire extinguisher falling on his back, believed to contribute to current pain - Has not taken prescribed tizanidine  due to concern about interaction with Lexapro  - has not tried much for the pain  Chronic bilateral low back pain without sciatica Suspect MSK strain Trial lidocaine  patches Sent referral to PT Discussed proper weight lifting strategies and provided rehab exercises Rx tizanidine  for temporary pain relief  PAIN:  Are you having pain? Yes: NPRS scale: 5/10 Pain location: low back, lower thoracic spine, extends laterally  Pain description: ache, tightness, stiffness Aggravating factors: prolonged walking, lifting  Relieving factors: heat, rest  PRECAUTIONS: None  RED FLAGS: None   WEIGHT BEARING RESTRICTIONS: No  FALLS:  Has patient fallen in last 6 months? No  LIVING ENVIRONMENT: Lives with: lives with their family Lives in: House/apartment Stairs: yes, denies difficulty  with negotiation  OCCUPATION: not currently working  PLOF: Independent  PATIENT GOALS: decrease pain  NEXT MD VISIT: PRN  OBJECTIVE:  Note: Objective measures were completed at Evaluation unless otherwise noted.  DIAGNOSTIC FINDINGS:  No recent imaging for referring dx noted  PATIENT SURVEYS:  Modified Oswestry:  MODIFIED OSWESTRY DISABILITY SCALE  Date: 01/05/25 Score  Pain intensity 3 =  Pain medication provides me with moderate relief from pain.  2. Personal care (washing, dressing, etc.) 1 =  I can take care of myself normally, but it increases my pain.  3. Lifting 1 = I can lift heavy weights, but it causes increased pain.  4. Walking 0 = Pain does not prevent me from walking any distance  5. Sitting 1 =  I can only sit in my favorite chair as long as I like.  6. Standing 1 =  I can stand as long as I want but, it increases my pain.  7. Sleeping 5 =  Pain prevents me from sleeping at all.  8. Social Life 2 = Pain prevents me from participating in more energetic activities (eg. sports, dancing).  9. Traveling 0 =  I can travel anywhere without increased pain.  10. Employment/ Homemaking 1 = My normal homemaking/job activities increase my pain, but I can still perform all that is required of me  Total 15/50   Interpretation of scores: Score Category Description  0-20% Minimal Disability The patient can cope with most living activities. Usually no treatment is indicated apart from advice on lifting, sitting and exercise  21-40% Moderate Disability The patient experiences more pain and difficulty with sitting, lifting and standing. Travel and social life are more difficult and they may be disabled from work. Personal care, sexual activity and sleeping are not grossly affected, and the patient can usually be managed by conservative means  41-60% Severe Disability Pain remains the main problem in this group, but activities of daily living are affected. These patients require a  detailed investigation  61-80% Crippled Back pain impinges on all aspects of the patients life. Positive intervention is required  81-100% Bed-bound These patients are either bed-bound or exaggerating their symptoms  [name]  A, Booth A, et al. Surgery versus conservative management of stable thoracolumbar fracture: the PRESTO feasibility RCT. [place] ([place]): [employer]; 2021 Nov. [employer] Assessment, No. 25.62.) Appendix 3, Oswestry Disability Index category descriptors. Available from: Findjewelers.cz  Minimally Clinically Important Difference (MCID) = 12.8%  COGNITION: Overall cognitive status: Within functional limits for tasks assessed     SENSATION: WFL   POSTURE: rounded shoulders and forward head  PALPATION: Inc TTP and resting tension noted L lumbar> R lumbar paraspinals and L posterior hip  LUMBAR ROM:   AROM eval  Flexion Nil loss, pain in central low back   Extension 25% loss, end range pain in central low back, slight inc L sided pain  Right lateral flexion Nil loss, tightness on L side of low back  Left lateral flexion 25% loss, inc left sided low back pain  Right rotation   Left rotation    (Blank rows = not tested)  LOWER EXTREMITY ROM:   WNL BLE  Active  Right eval Left eval  Hip flexion    Hip extension    Hip abduction    Hip adduction    Hip internal rotation    Hip external rotation    Knee flexion    Knee extension    Ankle dorsiflexion    Ankle plantarflexion    Ankle inversion    Ankle eversion     (Blank rows = not tested)  LOWER EXTREMITY MMT:  WNL BLE, 5/5  MMT Right eval Left eval  Hip flexion    Hip extension    Hip abduction    Hip adduction    Hip internal rotation    Hip external rotation    Knee flexion    Knee extension    Ankle dorsiflexion    Ankle plantarflexion    Ankle inversion    Ankle eversion     (Blank rows = not tested)   TREATMENT DATE:    01/05/25- EVAL                                                                                                                                  PATIENT EDUCATION:  Education details: Pt educated on relevant anatomy, physiology, pathology, diagnosis, prognosis, progression of care, pain and activity modification related to low back pain Person educated: Patient Education method: Explanation, Demonstration, and Handouts Education comprehension: verbalized understanding and returned demonstration  HOME EXERCISE PROGRAM: Access Code: [email or web address] URL: [email or web address] Date: 01/05/2025 Prepared by: Stann Ohara  Exercises - Standing Lumbar Extension  - 3-5 x daily - 7 x weekly - 1 sets - 10 reps - 2 hold  ASSESSMENT:  CLINICAL IMPRESSION: Patient is a 34 y.o. M who was seen today for physical therapy evaluation and treatment for low back pain. Pt symptoms are consistent with mechanical impingement of the lumbar spine with associated muscle guarding in the lumbar and lower thoracic region. Pt able to dec pain  and improve ROM with left side glide in standing using repeated end range extension of the lumbar spine. Pt stands to benefit from continued skilled physical therapy to address deficit areas and restore safety with activities and participations at home and in the community.    OBJECTIVE IMPAIRMENTS: decreased activity tolerance, decreased endurance, decreased ROM, increased fascial restrictions, increased muscle spasms, and pain.   ACTIVITY LIMITATIONS: carrying, lifting, bending, standing, squatting, and sleeping  PARTICIPATION LIMITATIONS: cleaning, laundry, shopping, and community activity  PERSONAL FACTORS: Behavior pattern, Past/current experiences, Time since onset of injury/illness/exacerbation, and 1 comorbidity: concurrent thoracic spine pain are also affecting patient's functional outcome.   REHAB POTENTIAL: Excellent  CLINICAL DECISION MAKING:  Stable/uncomplicated  EVALUATION COMPLEXITY: Low   GOALS: Goals reviewed with patient? Yes  SHORT TERM GOALS: Target date: 01/22/25   Pt will report compliance with HEP to work towards ind and home management strategies Baseline: Goal status: INITIAL   2.  Pt will score no greater than 5/50 on ODI to demonstrate improved activity tolerance Baseline:  Goal status: INITIAL   3.  Pt will improve lumbar spine ROM to full and painless in order to demonstrate progress towards activity tolerance and improved function Baseline:  Goal status: INITIAL      LONG TERM GOALS: Target date: 02/16/25   Pt will score no greater than 0/50 on ODI to demonstrate improved activity tolerance Baseline:  Goal status: INITIAL   2.  Pt will report no greater than 0/10 pain over 7 consecutive days to demonstrate maintained reduction in symptoms and improved tolerance to activity Baseline:  Goal status: INITIAL   3.  Pt will be ind in the management of their symptoms at home and in the community Baseline:  Goal status: INITIAL     PLAN:  PT FREQUENCY: 1-2x/week  PT DURATION: 6 weeks  PLANNED INTERVENTIONS: 97110-Therapeutic exercises, 97530- Therapeutic activity, [id number]- Neuromuscular re-education, 97535- Self Care, 02859- Manual therapy, [id number]- Electrical stimulation (unattended), 97016- Vasopneumatic device, 20560 (1-2 muscles), 20561 (3+ muscles)- Dry Needling, Patient/Family education, Cryotherapy, and Moist heat.  PLAN FOR NEXT SESSION: modify HEP, soft tissue mobilization of the thoracolumbar region as indicated, lower quarter strength and stability through functional movement patterns.    Stann DELENA Ohara, PT 01/05/2025, 11:04 PM  "

## 2025-01-17 ENCOUNTER — Encounter: Payer: Self-pay | Admitting: Family Medicine

## 2025-01-20 ENCOUNTER — Other Ambulatory Visit (HOSPITAL_COMMUNITY): Payer: Self-pay

## 2025-01-20 ENCOUNTER — Ambulatory Visit: Payer: Self-pay | Admitting: Family Medicine

## 2025-01-20 ENCOUNTER — Encounter: Payer: Self-pay | Admitting: Family Medicine

## 2025-01-20 VITALS — BP 125/67 | HR 71 | Ht 71.0 in | Wt 197.6 lb

## 2025-01-20 DIAGNOSIS — R072 Precordial pain: Secondary | ICD-10-CM | POA: Diagnosis present

## 2025-01-20 DIAGNOSIS — R001 Bradycardia, unspecified: Secondary | ICD-10-CM | POA: Diagnosis not present

## 2025-01-20 DIAGNOSIS — J4520 Mild intermittent asthma, uncomplicated: Secondary | ICD-10-CM | POA: Diagnosis not present

## 2025-01-20 MED ORDER — ALBUTEROL SULFATE HFA 108 (90 BASE) MCG/ACT IN AERS
2.0000 | INHALATION_SPRAY | RESPIRATORY_TRACT | 0 refills | Status: AC | PRN
  Filled 2025-01-20: qty 6.7, 25d supply, fill #0

## 2025-01-20 NOTE — Patient Instructions (Addendum)
 It was great to see you today! Thank you for choosing [facility] for your primary care.  Today we addressed:  Chest pain Overall, things look good today.  I would like to have you get an ultrasound of your heart (called an echocardiogram) and I would like you to see Cardiology because of the heart disease history in your family.  You will get a phone call to schedule the follow up Cardiology appointment and the ultrasound.  Please remember to go to the ED or call 911 if you have chest pain that does not go away after a few minutes, if you cannot breathe, or if something else feels wrong with your heart.  Asthma I refilled your inhaler.  We are checking some labs today, including TSH, BMP, and lipid panel.  You will get a MyChart message or a letter if results are normal. Otherwise, you will get a call from us .  If you had a referral placed, they will call you to set up an appointment. Please give us  a call if you don't hear back in the next 2 weeks.  You should return to our clinic in a few weeks for a physical with your PCP.  Thank you for coming to see us  at [facility] and for the opportunity to care for you! Liisa Picone, MD 01/20/2025, 2:13 PM

## 2025-01-20 NOTE — Progress Notes (Signed)
" ° °  SUBJECTIVE:   CHIEF COMPLAINT / HPI:  Cody [name] is a 34 y.o. male with a pertinent past medical history of asthma and GERD presenting to the clinic for chest pain evaluation.  Chest pain Last night, the patient lit some incense in the home and when he inhaled it, he felt chest pain and chest tightness.  The chest pain resolved quickly, but the chest tightness has remained. He reports he has had brief episodes of chest pain a few times over the past few weeks (1-3 times per week). Patient does note that he was doing a lot of lifting yesterday. Has GERD and takes famotidine  daily, this felt different from normal reflux. Drinks 1/2 cup of coffee daily, previously had palpitations from drinking too much caffeine . Has had 2 uncles die of heart attacks in their 30s and 31s. Patient's brother also has a lot of chest pains and has some heart issues.  Patient reports he had an asthma diagnosis as a child, but has not needed an inhaler in a while. He used the inhaler last night without much benefit. Requests inhaler refill right now.   PERTINENT PMH / PSH: GERD Chronic constipation  *Remainder reviewed in problem list.   OBJECTIVE:   BP 125/67   Pulse 71   Ht 5' 11 (1.803 m)   Wt 197 lb 9.6 oz (89.6 kg)   SpO2 99%   BMI 27.56 kg/m   General: Age-appropriate, resting comfortably in chair, NAD, alert and at baseline. Cardiovascular: Reproducible precordial pain to palpation.  Bradycardic rate and regular rhythm. Normal S1/S2. No murmurs, rubs, or gallops appreciated. 2+ radial pulses. Pulmonary: Clear bilaterally to ascultation. No wheezes, crackles, or rhonchi. Normal WOB on room air. No accessory muscle use. Abdominal: No tenderness to deep or light palpation. No rebound or guarding. No HSM. Skin: Warm and dry. Extremities: No peripheral edema bilaterally. Capillary refill <2 seconds.  Tests EKG: Sinus bradycardia, QTc 382, mild benign early repol, no ST elevations or acute  ischemic changes.   ASSESSMENT/PLAN:   Assessment & Plan Precordial pain Atypical chest pain now resolved.  Favor MSK etiology is most likely given reproducible precordial pain on palpation.  EKG today with sinus bradycardia, no ST elevations or acute ischemic changes.  Do not suspect ACS.  No indication for ED evaluation or troponins.  Cannot rule out stable angina.  Symptomatology not consistent with GERD per patient.  Patient does notably have a strong family history of cardiac death and 2 uncles prior to the age of 74. - Echocardiogram - Ambulatory referral to cardiology given family cardiac history, may need stress testing - Strict ED/EMS precautions in the event of recurring chest pain, dyspnea, palpitations, or other abnormal cardiac sensation Bradycardia On chart review, patient has had multiple clinic visits where he was borderline bradycardic.  Asymptomatic at the moment.  No syncope/presyncope, dizziness, weakness. Mild intermittent asthma, unspecified whether complicated Has not used inhaler in several weeks prior to yesterday.  No dyspnea, wheezing, unremarkable lung exam today.  Asthma still staged as mild intermittent. - Refilled albuterol  inhaler by patient request  Follow-up in a few weeks for physical exam with PCP.  Sheridan Hew Toma, MD [facility] "

## 2025-01-20 NOTE — Assessment & Plan Note (Signed)
 Has not used inhaler in several weeks prior to yesterday.  No dyspnea, wheezing, unremarkable lung exam today.  Asthma still staged as mild intermittent. - Refilled albuterol  inhaler by patient request

## 2025-01-21 LAB — BASIC METABOLIC PANEL WITH GFR
BUN/Creatinine Ratio: 9 (ref 9–20)
BUN: 11 mg/dL (ref 6–20)
CO2: 24 mmol/L (ref 20–29)
Calcium: 9.3 mg/dL (ref 8.7–10.2)
Chloride: 105 mmol/L (ref 96–106)
Creatinine, Ser: 1.26 mg/dL (ref 0.76–1.27)
Glucose: 59 mg/dL — ABNORMAL LOW (ref 70–99)
Potassium: 4.2 mmol/L (ref 3.5–5.2)
Sodium: 142 mmol/L (ref 134–144)
eGFR: 76 mL/min/{1.73_m2}

## 2025-01-21 LAB — LIPID PANEL
Chol/HDL Ratio: 5 ratio (ref 0.0–5.0)
Cholesterol, Total: 194 mg/dL (ref 100–199)
HDL: 39 mg/dL — ABNORMAL LOW
LDL Chol Calc (NIH): 139 mg/dL — ABNORMAL HIGH (ref 0–99)
Triglycerides: 87 mg/dL (ref 0–149)
VLDL Cholesterol Cal: 16 mg/dL (ref 5–40)

## 2025-01-21 LAB — TSH: TSH: 2.16 u[IU]/mL (ref 0.450–4.500)

## 2025-01-24 ENCOUNTER — Ambulatory Visit: Payer: Self-pay | Admitting: Family Medicine

## 2025-01-24 ENCOUNTER — Other Ambulatory Visit: Payer: Self-pay | Admitting: Family Medicine

## 2025-01-24 DIAGNOSIS — F419 Anxiety disorder, unspecified: Secondary | ICD-10-CM

## 2025-01-25 ENCOUNTER — Other Ambulatory Visit: Payer: Self-pay

## 2025-01-25 MED ORDER — ESCITALOPRAM OXALATE 5 MG PO TABS
5.0000 mg | ORAL_TABLET | Freq: Every day | ORAL | 3 refills | Status: AC
  Filled 2025-01-25: qty 30, 30d supply, fill #0

## 2025-03-06 ENCOUNTER — Other Ambulatory Visit (HOSPITAL_COMMUNITY)
